# Patient Record
Sex: Female | Born: 1980 | Race: Asian | Hispanic: No | Marital: Married | Smoking: Former smoker
Health system: Southern US, Community
[De-identification: ages and names within clinical notes are randomized; demographics above are authoritative.]

## PROBLEM LIST (undated history)

## (undated) ENCOUNTER — Inpatient Hospital Stay (HOSPITAL_COMMUNITY): Payer: Self-pay

## (undated) DIAGNOSIS — Z789 Other specified health status: Secondary | ICD-10-CM

## (undated) DIAGNOSIS — L309 Dermatitis, unspecified: Secondary | ICD-10-CM

## (undated) DIAGNOSIS — E739 Lactose intolerance, unspecified: Secondary | ICD-10-CM

## (undated) DIAGNOSIS — A483 Toxic shock syndrome: Secondary | ICD-10-CM

## (undated) DIAGNOSIS — R011 Cardiac murmur, unspecified: Secondary | ICD-10-CM

## (undated) DIAGNOSIS — T7840XA Allergy, unspecified, initial encounter: Secondary | ICD-10-CM

## (undated) HISTORY — DX: Other specified health status: Z78.9

## (undated) HISTORY — DX: Cardiac murmur, unspecified: R01.1

## (undated) HISTORY — PX: NO PAST SURGERIES: SHX2092

## (undated) HISTORY — DX: Allergy, unspecified, initial encounter: T78.40XA

---

## 2017-01-11 NOTE — L&D Delivery Note (Addendum)
Patient: Alyssa Oneill MRN: [id number]  GBS status: Negative   Patient is a 37 y.o. now G2P1011 s/p NSVD at [redacted]w[redacted]d, who was admitted for SOL. SROM 4h 39m prior to delivery with clear fluid, however during delivery found to have heavy meconium.     Delivery Note At 12:53 PM a viable female was delivered via Vaginal, Spontaneous (Presentation: Left OA).  APGAR: 9, 9; weight pending.   Placenta status: Spontaneous, with retained placental tissue and membrane, manual sweep performed as below. Cord: Intact, 3 vessel with the following complications: retained placenta.   Anesthesia:   Episiotomy: None Lacerations: 2nd degree;Perineal Suture Repair: 3.0 vicryl Est. Blood Loss (mL): ~850   Head delivered left OA. No nuchal cord present. Shoulder and body delivered in usual fashion. Infant with spontaneous cry, placed on mother's abdomen, dried and bulb suctioned. Cord clamped x 2 after 1-minute delay, and cut by family member. Cord blood drawn. Placenta delivered spontaneously with gentle cord traction. Fundus firm with massage and Pitocin. Noted consistent trickle of uterine bleeding, gave cytotec 800mg  rectally. Further evaluated placenta, noted a chuck of placenta tissue and membrane missing. Dr. Aneta Mins and Dr. Debroah Loop performed a uterine sweep with successful retrieval of remaining membrane and placental tissue. Will receive 2g Ancef IV once. Perineum inspected and found to have a 2nd degree laceration, which was repaired with 4.0 vicryl with good hemostasis achieved.  Mom to postpartum.  Baby to Couplet care / Skin to Skin.  Allayne Stack 11/04/2017, 1:51 PM   OB FELLOW DELIVERY ATTESTATION  I was gloved and present for the delivery in its entirety, and I agree with the above resident's note.    Gwenevere Abbot, MD OB Fellow  11/04/2017, 7:50 PM

## 2017-03-17 ENCOUNTER — Ambulatory Visit (INDEPENDENT_AMBULATORY_CARE_PROVIDER_SITE_OTHER): Payer: Self-pay | Admitting: Family Medicine

## 2017-03-17 ENCOUNTER — Encounter: Payer: Self-pay | Admitting: Family Medicine

## 2017-03-17 VITALS — BP 94/72 | HR 76 | Temp 98.5°F | Ht 60.0 in | Wt 99.7 lb

## 2017-03-17 DIAGNOSIS — Z91018 Allergy to other foods: Secondary | ICD-10-CM

## 2017-03-17 DIAGNOSIS — Z7689 Persons encountering health services in other specified circumstances: Secondary | ICD-10-CM

## 2017-03-17 DIAGNOSIS — Z3A01 Less than 8 weeks gestation of pregnancy: Secondary | ICD-10-CM

## 2017-03-17 MED ORDER — EPINEPHRINE 0.3 MG/0.3ML IJ SOAJ: 0.3000 mg | Freq: Once | INTRAMUSCULAR | 2 refills | Status: AC

## 2017-03-17 NOTE — Patient Instructions (Addendum)
Continue taking prenatal vitamins each day.  You should find an Obstetrician to establish care with as well. Remember to increase your intake of water daily. You can take Tylenol as needed for any aches and pain she might have. First Trimester of Pregnancy The first trimester of pregnancy is from week 1 until the end of week 13 (months 1 through 3). A week after a sperm fertilizes an egg, the egg will implant on the wall of the uterus. This embryo will begin to develop into a baby. Genes from you and your partner will form the baby. The female genes will determine whether the baby will be a boy or a girl. At 6-8 weeks, the eyes and face will be formed, and the heartbeat can be seen on ultrasound. At the end of 12 weeks, all the baby's organs will be formed. Now that you are pregnant, you will want to do everything you can to have a healthy baby. Two of the most important things are to get good prenatal care and to follow your health care provider's instructions. Prenatal care is all the medical care you receive before the baby's birth. This care will help prevent, find, and treat any problems during the pregnancy and childbirth. Body changes during your first trimester Your body goes through many changes during pregnancy. The changes vary from woman to woman.  You may gain or lose a couple of pounds at first.  You may feel sick to your stomach (nauseous) and you may throw up (vomit). If the vomiting is uncontrollable, call your health care provider.  You may tire easily.  You may develop headaches that can be relieved by medicines. All medicines should be approved by your health care provider.  You may urinate more often. Painful urination may mean you have a bladder infection.  You may develop heartburn as a result of your pregnancy.  You may develop constipation because certain hormones are causing the muscles that push stool through your intestines to slow down.  You may develop hemorrhoids  or swollen veins (varicose veins).  Your breasts may begin to grow larger and become tender. Your nipples may stick out more, and the tissue that surrounds them (areola) may become darker.  Your gums may bleed and may be sensitive to brushing and flossing.  Dark spots or blotches (chloasma, mask of pregnancy) may develop on your face. This will likely fade after the baby is born.  Your menstrual periods will stop.  You may have a loss of appetite.  You may develop cravings for certain kinds of food.  You may have changes in your emotions from day to day, such as being excited to be pregnant or being concerned that something may go wrong with the pregnancy and baby.  You may have more vivid and strange dreams.  You may have changes in your hair. These can include thickening of your hair, rapid growth, and changes in texture. Some women also have hair loss during or after pregnancy, or hair that feels dry or thin. Your hair will most likely return to normal after your baby is born.  What to expect at prenatal visits During a routine prenatal visit:  You will be weighed to make sure you and the baby are growing normally.  Your blood pressure will be taken.  Your abdomen will be measured to track your baby's growth.  The fetal heartbeat will be listened to between weeks 10 and 14 of your pregnancy.  Test results from any previous  visits will be discussed.  Your health care provider may ask you:  How you are feeling.  If you are feeling the baby move.  If you have had any abnormal symptoms, such as leaking fluid, bleeding, severe headaches, or abdominal cramping.  If you are using any tobacco products, including cigarettes, chewing tobacco, and electronic cigarettes.  If you have any questions.  Other tests that may be performed during your first trimester include:  Blood tests to find your blood type and to check for the presence of any previous infections. The tests will  also be used to check for low iron levels (anemia) and protein on red blood cells (Rh antibodies). Depending on your risk factors, or if you previously had diabetes during pregnancy, you may have tests to check for high blood sugar that affects pregnant women (gestational diabetes).  Urine tests to check for infections, diabetes, or protein in the urine.  An ultrasound to confirm the proper growth and development of the baby.  Fetal screens for spinal cord problems (spina bifida) and Down syndrome.  HIV (human immunodeficiency virus) testing. Routine prenatal testing includes screening for HIV, unless you choose not to have this test.  You may need other tests to make sure you and the baby are doing well.  Follow these instructions at home: Medicines  Follow your health care provider's instructions regarding medicine use. Specific medicines may be either safe or unsafe to take during pregnancy.  Take a prenatal vitamin that contains at least 600 micrograms (mcg) of folic acid.  If you develop constipation, try taking a stool softener if your health care provider approves. Eating and drinking  Eat a balanced diet that includes fresh fruits and vegetables, whole grains, good sources of protein such as meat, eggs, or tofu, and low-fat dairy. Your health care provider will help you determine the amount of weight gain that is right for you.  Avoid raw meat and uncooked cheese. These carry germs that can cause birth defects in the baby.  Eating four or five small meals rather than three large meals a day may help relieve nausea and vomiting. If you start to feel nauseous, eating a few soda crackers can be helpful. Drinking liquids between meals, instead of during meals, also seems to help ease nausea and vomiting.  Limit foods that are high in fat and processed sugars, such as fried and sweet foods.  To prevent constipation: ? Eat foods that are high in fiber, such as fresh fruits and  vegetables, whole grains, and beans. ? Drink enough fluid to keep your urine clear or pale yellow. Activity  Exercise only as directed by your health care provider. Most women can continue their usual exercise routine during pregnancy. Try to exercise for 30 minutes at least 5 days a week. Exercising will help you: ? Control your weight. ? Stay in shape. ? Be prepared for labor and delivery.  Experiencing pain or cramping in the lower abdomen or lower back is a good sign that you should stop exercising. Check with your health care provider before continuing with normal exercises.  Try to avoid standing for long periods of time. Move your legs often if you must stand in one place for a long time.  Avoid heavy lifting.  Wear low-heeled shoes and practice good posture.  You may continue to have sex unless your health care provider tells you not to. Relieving pain and discomfort  Wear a good support bra to relieve breast tenderness.  Take  warm sitz baths to soothe any pain or discomfort caused by hemorrhoids. Use hemorrhoid cream if your health care provider approves.  Rest with your legs elevated if you have leg cramps or low back pain.  If you develop varicose veins in your legs, wear support hose. Elevate your feet for 15 minutes, 3-4 times a day. Limit salt in your diet. Prenatal care  Schedule your prenatal visits by the twelfth week of pregnancy. They are usually scheduled monthly at first, then more often in the last 2 months before delivery.  Write down your questions. Take them to your prenatal visits.  Keep all your prenatal visits as told by your health care provider. This is important. Safety  Wear your seat belt at all times when driving.  Make a list of emergency phone numbers, including numbers for family, friends, the hospital, and police and fire departments. General instructions  Ask your health care provider for a referral to a local prenatal education class.  Begin classes no later than the beginning of month 6 of your pregnancy.  Ask for help if you have counseling or nutritional needs during pregnancy. Your health care provider can offer advice or refer you to specialists for help with various needs.  Do not use hot tubs, steam rooms, or saunas.  Do not douche or use tampons or scented sanitary pads.  Do not cross your legs for long periods of time.  Avoid cat litter boxes and soil used by cats. These carry germs that can cause birth defects in the baby and possibly loss of the fetus by miscarriage or stillbirth.  Avoid all smoking, herbs, alcohol, and medicines not prescribed by your health care provider. Chemicals in these products affect the formation and growth of the baby.  Do not use any products that contain nicotine or tobacco, such as cigarettes and e-cigarettes. If you need help quitting, ask your health care provider. You may receive counseling support and other resources to help you quit.  Schedule a dentist appointment. At home, brush your teeth with a soft toothbrush and be gentle when you floss. Contact a health care provider if:  You have dizziness.  You have mild pelvic cramps, pelvic pressure, or nagging pain in the abdominal area.  You have persistent nausea, vomiting, or diarrhea.  You have a bad smelling vaginal discharge.  You have pain when you urinate.  You notice increased swelling in your face, hands, legs, or ankles.  You are exposed to fifth disease or chickenpox.  You are exposed to [place] measles (rubella) and have never had it. Get help right away if:  You have a fever.  You are leaking fluid from your vagina.  You have spotting or bleeding from your vagina.  You have severe abdominal cramping or pain.  You have rapid weight gain or loss.  You vomit blood or material that looks like coffee grounds.  You develop a severe headache.  You have shortness of breath.  You have any kind of  trauma, such as from a fall or a car accident. Summary  The first trimester of pregnancy is from week 1 until the end of week 13 (months 1 through 3).  Your body goes through many changes during pregnancy. The changes vary from woman to woman.  You will have routine prenatal visits. During those visits, your health care provider will examine you, discuss any test results you may have, and talk with you about how you are feeling. This information is not intended  to replace advice given to you by your health care provider. Make sure you discuss any questions you have with your health care provider. Document Released: 12/22/2000 Document Revised: 12/10/2015 Document Reviewed: 12/10/2015 Elsevier Interactive Patient Education  2018 [employer]  How a Baby Grows During Pregnancy Pregnancy begins when a female's sperm enters a female's egg (fertilization). This happens in one of the tubes (fallopian tubes) that connect the ovaries to the womb (uterus). The fertilized egg is called an embryo until it reaches 10 weeks. From 10 weeks until birth, it is called a fetus. The fertilized egg moves down the fallopian tube to the uterus. Then it implants into the lining of the uterus and begins to grow. The developing fetus receives oxygen and nutrients through the pregnant woman's bloodstream and the tissues that grow (placenta) to support the fetus. The placenta is the life support system for the fetus. It provides nutrition and removes waste. Learning as much as you can about your pregnancy and how your baby is developing can help you enjoy the experience. It can also make you aware of when there might be a problem and when to ask questions. How long does a typical pregnancy last? A pregnancy usually lasts 280 days, or about 40 weeks. Pregnancy is divided into three trimesters:  First trimester: 0-13 weeks.  Second trimester: 14-27 weeks.  Third trimester: 28-40 weeks.  The day when your baby is  considered ready to be born (full term) is your estimated date of delivery. How does my baby develop month by month? First month  The fertilized egg attaches to the inside of the uterus.  Some cells will form the placenta. Others will form the fetus.  The arms, legs, brain, spinal cord, lungs, and heart begin to develop.  At the end of the first month, the heart begins to beat.  Second month  The bones, inner ear, eyelids, hands, and feet form.  The genitals develop.  By the end of 8 weeks, all major organs are developing.  Third month  All of the internal organs are forming.  Teeth develop below the gums.  Bones and muscles begin to grow. The spine can flex.  The skin is transparent.  Fingernails and toenails begin to form.  Arms and legs continue to grow longer, and hands and feet develop.  The fetus is about 3 in (7.6 cm) long.  Fourth month  The placenta is completely formed.  The external sex organs, neck, outer ear, eyebrows, eyelids, and fingernails are formed.  The fetus can hear, swallow, and move its arms and legs.  The kidneys begin to produce urine.  The skin is covered with a white waxy coating (vernix) and very fine hair (lanugo).  Fifth month  The fetus moves around more and can be felt for the first time (quickening).  The fetus starts to sleep and wake up and may begin to suck its finger.  The nails grow to the end of the fingers.  The organ in the digestive system that makes bile (gallbladder) functions and helps to digest the nutrients.  If your baby is a girl, eggs are present in her ovaries. If your baby is a boy, testicles start to move down into his scrotum.  Sixth month  The lungs are formed, but the fetus is not yet able to breathe.  The eyes open. The brain continues to develop.  Your baby has fingerprints and toe prints. Your baby's hair grows thicker.  At the end of  the second trimester, the fetus is about 9 in (22.9 cm)  long.  Seventh month  The fetus kicks and stretches.  The eyes are developed enough to sense changes in light.  The hands can make a grasping motion.  The fetus responds to sound.  Eighth month  All organs and body systems are fully developed and functioning.  Bones harden and taste buds develop. The fetus may hiccup.  Certain areas of the brain are still developing. The skull remains soft.  Ninth month  The fetus gains about  lb (0.23 kg) each week.  The lungs are fully developed.  Patterns of sleep develop.  The fetus's head typically moves into a head-down position (vertex) in the uterus to prepare for birth. If the buttocks move into a vertex position instead, the baby is breech.  The fetus weighs 6-9 lbs (2.72-4.08 kg) and is 19-20 in (48.26-50.8 cm) long.  What can I do to have a healthy pregnancy and help my baby develop? Eating and Drinking  Eat a healthy diet. ? Talk with your health care provider to make sure that you are getting the nutrients that you and your baby need. ? Visit www.[employer].be to learn about creating a healthy diet.  Gain a healthy amount of weight during pregnancy as advised by your health care provider. This is usually 25-35 pounds. You may need to: ? Gain more if you were underweight before getting pregnant or if you are pregnant with more than one baby. ? Gain less if you were overweight or obese when you got pregnant.  Medicines and Vitamins  Take prenatal vitamins as directed by your health care provider. These include vitamins such as folic acid, iron, calcium, and vitamin D. They are important for healthy development.  Take medicines only as directed by your health care provider. Read labels and ask a pharmacist or your health care provider whether over-the-counter medicines, supplements, and prescription drugs are safe to take during pregnancy.  Activities  Be physically active as advised by your health care provider. Ask  your health care provider to recommend activities that are safe for you to do, such as walking or swimming.  Do not participate in strenuous or extreme sports.  Lifestyle  Do not drink alcohol.  Do not use any tobacco products, including cigarettes, chewing tobacco, or electronic cigarettes. If you need help quitting, ask your health care provider.  Do not use illegal drugs.  Safety  Avoid exposure to mercury, lead, or other heavy metals. Ask your health care provider about common sources of these heavy metals.  Avoid listeria infection during pregnancy. Follow these precautions: ? Do not eat soft cheeses or deli meats. ? Do not eat hot dogs unless they have been warmed up to the point of steaming, such as in the microwave oven. ? Do not drink unpasteurized milk.  Avoid toxoplasmosis infection during pregnancy. Follow these precautions: ? Do not change your cat's litter box, if you have a cat. Ask someone else to do this for you. ? Wear gardening gloves while working in the yard.  General Instructions  Keep all follow-up visits as directed by your health care provider. This is important. This includes prenatal care and screening tests.  Manage any chronic health conditions. Work closely with your health care provider to keep conditions, such as diabetes, under control.  How do I know if my baby is developing well? At each prenatal visit, your health care provider will do several different tests to check on  your health and keep track of your baby's development. These include:  Fundal height. ? Your health care provider will measure your growing belly from top to bottom using a tape measure. ? Your health care provider will also feel your belly to determine your baby's position.  Heartbeat. ? An ultrasound in the first trimester can confirm pregnancy and show a heartbeat, depending on how far along you are. ? Your health care provider will check your baby's heart rate at every  prenatal visit. ? As you get closer to your delivery date, you may have regular fetal heart rate monitoring to make sure that your baby is not in distress.  Second trimester ultrasound. ? This ultrasound checks your baby's development. It also indicates your baby's gender.  What should I do if I have concerns about my baby's development? Always talk with your health care provider about any concerns that you may have. This information is not intended to replace advice given to you by your health care provider. Make sure you discuss any questions you have with your health care provider. Document Released: 06/16/2007 Document Revised: 06/05/2015 Document Reviewed: 06/06/2013 Elsevier Interactive Patient Education  2018 [employer].  Pregnancy After Age 56 Women who become pregnant after the age of 73 have a higher risk for certain problems during pregnancy. This is because older women may already have health problems before becoming pregnant. Older women who are healthy before pregnancy may still develop problems during pregnancy. These problems may affect the mother, the unborn baby (fetus), or both. What are the risks for me? If you are over age 73 and you want to become pregnant or are pregnant, you may have a higher risk of:  Not being able to get pregnant (infertility).  Going into labor early (preterm labor).  Needing surgical delivery of your baby (cesarean delivery, or C-section).  Having high blood pressure (hypertension).  Having complications during pregnancy, such as high blood pressure and other symptoms (preeclampsia).  Having diabetes during pregnancy (gestational diabetes).  Being pregnant with more than one baby.  Loss of the unborn baby before 20 weeks (miscarriage) or after 20 weeks of pregnancy (stillbirth).  What are the risks for my baby? Babies born to women over the age of 47 have a higher risk for:  Being born early (prematurity).  Low birth weight, which  is less than 5 lb, 8 oz (2.5 kg).  Birth defects, such as Down syndrome and cleft palate.  Health complications, including problems with growth and development.  How is prenatal care different for women over age 1? All women should see their health care provider before they try to become pregnant. This is especially important for women over the age of 42. Tell your health care provider about:  Any health problems you have.  Any medicines you take.  Any family history of health problems or chromosome-related defects.  Any problems you have had with past pregnancies or deliveries.  If you are over age 74 and you plan to become pregnant:  Start taking a daily multivitamin a month or more before you try to get pregnant. Your multivitamin should contain 400 mcg (micrograms) of folic acid.  If you are over age 60 and pregnant, make sure you:  Keep taking your multivitamin unless your health care provider tells you not to take it.  Keep all prenatal visits as told by your health care provider. This is important.  Have ultrasounds regularly throughout your pregnancy to check for problems.  Talk  with your health care provider about other prenatal screening tests that you may need.  What additional prenatal tests are needed? Screening tests show whether your baby has a higher risk for birth defects than other babies. Screening tests include:  Ultrasound tests to look for markers that indicate a risk for birth defects.  Maternal blood screening. These are blood tests that measure certain substances in your blood to determine your baby's risk for defects.  Screening tests do not show whether your baby has or does not have defects. They only show your baby's risk for certain defects. If your screening tests show that risk factors are present, you may need tests to confirm the defect (diagnostic testing). These tests may include:  Chorionic villus sampling. For this procedure, a tissue  sample is taken from the organ that forms in your uterus to nourish your baby (placenta). The sample is removed through your cervix or abdomen and tested.  Amniocentesis. For this procedure, a small amount of the fluid that surrounds the baby in the uterus (amniotic fluid) is removed and tested.  What can I do to stay healthy during my pregnancy? Staying healthy during pregnancy can help you and your baby to have a lower risk for problems during pregnancy, during delivery, or both. Talk with your health care provider for specific instructions about staying healthy during your pregnancy. Nutrition  At each meal, eat a variety of foods from each of the five food groups. These groups include: ? Proteins such as lean meats, poultry, fish that is low in fat, beans, eggs, and nuts. ? Vegetables such as leafy greens, raw and cooked vegetables, and vegetable juice. ? Fruits that are fresh, frozen, or canned, or 100% fruit juice. ? Dairy products such as low-fat yogurt, cheese, and milk. ? Whole grains including rice, cereal, pasta, and bread.  Talk with your health care provider about how much food in each group is right for you.  Follow instructions from your health care provider about eating and drinking restrictions during pregnancy. ? Do not eat raw eggs, raw meat, or raw fish or seafood. ? Do not eat any fish that contains high amounts of mercury, such as swordfish or mackerel.  Drink 6-8 or more glasses of water a day. You should drink enough fluid to keep your urine pale yellow. Managing weight gain  Ask your health care provider how much weight gain is healthy during pregnancy.  Stay at a healthy weight. If needed, work with your health care provider to lose weight safely. Activity  Exercise regularly, as directed by your health care provider. Ask your health care provider what forms of exercise are safe for you. General instructions  Do not use any products that contain nicotine or  tobacco, such as cigarettes and e-cigarettes. If you need help quitting, ask your health care provider.  Do not drink alcohol, use drugs, or abuse prescription medicine.  Take over-the-counter and prescription medicines only as told by your health care provider.  Do not use hot tubs, steam rooms, or saunas.  Talk with your health care provider about your risk of exposure to harmful environmental conditions. This includes exposure to chemicals, radiation, cleaning products, and cat feces. Follow advice from your health care provider about how to limit your exposure. Summary  Women who become pregnant after the age of 42 have a higher risk for complications during pregnancy.  Problems may affect the mother, the unborn baby (fetus), or both.  All women should see their health  care provider before they try to become pregnant. This is especially important for women over the age of 73.  Staying healthy during pregnancy can help both you and your baby to have a lower risk for some of the problems that can happen during pregnancy, during delivery, or both. This information is not intended to replace advice given to you by your health care provider. Make sure you discuss any questions you have with your health care provider. Document Released: 04/19/2016 Document Revised: 04/19/2016 Document Reviewed: 04/19/2016 Elsevier Interactive Patient Education  2018 [employer].

## 2017-03-17 NOTE — Progress Notes (Signed)
Patient presents to clinic today to establish care.  She is accompanied by her boyfriend.  SUBJECTIVE: PMH: Pt is a 37 yo with pmh sig for seasonal allergies, eczema, heart murmur, history of anemia.  Pt recently moved to the area from [place].  Pregnancy: -LMP 02/04/17 -Patient endorses taking 2 positive pregnancy tests.  The last one was yesterday. -Pt does not have an OB provider in the area -Pt is a G1P0 -pt states she has been taking PNVs. -she denies vaginal d/c, urinary frequency, dysuria, vaginal bleeding, or pelvic pain, constipation. -pt endorses some nausea, pelvic pressure/bloating. -Patient drinking maybe 2 (16.9 oz) bottles of water per day.  Heart murmur: -Patient states she was told this is a child -Patient endorses workup including echo which was done sometime ago. -Patient denies palpitations, chest pain, shortness of breath.  Allergies: Advil-eye swelling, cannot breathe Pecans and walnuts-facial edema, hives, diarrhea Avocados and bananas-itchy throat  Past surgical history: None  Social history: She is accompanied by her boyfriend visit.  Patient is from the [place] area.  Patient currently works at [employer].  Patient denies tobacco, alcohol, drug use.  Family medical history: Mom-alive, HLD Dad-deceased, HTN, stroke Sister-Francis, alive Sister-Vanessa, alive  Health Maintenance: PAP -- unsure   Past Medical History:  Diagnosis Date  . Allergy   . Heart murmur     History reviewed. No pertinent surgical history.  Current Outpatient Medications on File Prior to Visit  Medication Sig Dispense Refill  . Prenatal Multivit-Min-Fe-FA (PRENATAL VITAMINS PO) Take by mouth daily.     No current facility-administered medications on file prior to visit.     Allergies  Allergen Reactions  . Advil [Ibuprofen] Anaphylaxis    Family History  Problem Relation Age of Onset  . Hyperlipidemia Mother   . Hypertension Father   . Stroke Father      Social History   Socioeconomic History  . Marital status: Unknown    Spouse name: Not on file  . Number of children: Not on file  . Years of education: Not on file  . Highest education level: Not on file  Social Needs  . Financial resource strain: Not on file  . Food insecurity - worry: Not on file  . Food insecurity - inability: Not on file  . Transportation needs - medical: Not on file  . Transportation needs - non-medical: Not on file  Occupational History  . Not on file  Tobacco Use  . Smoking status: Never Smoker  . Smokeless tobacco: Never Used  Substance and Sexual Activity  . Alcohol use: No    Frequency: Never  . Drug use: No  . Sexual activity: Yes  Other Topics Concern  . Not on file  Social History Narrative  . Not on file    ROS General: Denies fever, chills, night sweats, changes in weight, changes in appetite HEENT: Denies headaches, ear pain, changes in vision, rhinorrhea, sore throat CV: Denies CP, palpitations, SOB, orthopnea Pulm: Denies SOB, cough, wheezing GI: Denies abdominal pain, nausea, vomiting, diarrhea, constipation GU: Denies dysuria, hematuria, frequency, vaginal discharge  +Urine pregnancy tests, absent menses. Msk: Denies muscle cramps, joint pains Neuro: Denies weakness, numbness, tingling Skin: Denies rashes, bruising Psych: Denies depression, anxiety, hallucinations  BP 94/72 (BP Location: Left Arm, Patient Position: Sitting, Cuff Size: Normal)   Pulse 76   Temp 98.5 F (36.9 C) (Oral)   Ht 5' (1.524 m)   Wt 99 lb 11.2 oz (45.2 kg)   LMP  02/04/2017 (Exact Date)   SpO2 98%   BMI 19.47 kg/m   Physical Exam Gen. Pleasant, well developed, well-nourished, in NAD HEENT - Carrier/AT, PERRL, no scleral icterus, no nasal drainage, pharynx without erythema or exudate. Lungs: no use of accessory muscles, no dullness to percussion, CTAB, no wheezes, rales or rhonchi Cardiovascular: RRR, 2/6 murmur best heard at L lower sternal border,  no peripheral edema Abdomen: BS present, soft, nontender,nondistended Neuro:  A&Ox3, CN II-XII intact, normal gait Skin:  Warm, dry, intact, no lesions Psych: normal affect, mood appropriate  No results found for this or any previous visit (from the past 2160 hour(s)).  Assessment/Plan: Less than [redacted] weeks gestation of pregnancy -high risk 2/2 AMA -LMP 02/04/17.  EDD 11/11/17.  Pt approx. 5wk6d  G1P0 -Pt advised to establish care with OB/Gyn for Ob intake. -Pt given a list of area OB providers. -Pt advised to continue PNV, increase po intake of water, etc -pt given several handouts -Given ED precautions for sudden pain, bleeding, etc.  Encounter to establish care -We reviewed the PMH, PSH, FH, SH, Meds and Allergies. -We provided refills for any medications we will prescribe as needed. -We addressed current concerns per orders and patient instructions. -We have asked for records for pertinent exams, studies, vaccines and notes from previous providers. -We have advised patient to follow up per instructions below.  Multiple food allergies -Allergy to pecans, walnuts, avocado, bananas -Rx sent in for EpiPen  F/u prn  Abbe AmsterdamShannon Thessaly Mccullers, MD

## 2017-05-02 ENCOUNTER — Encounter (HOSPITAL_COMMUNITY): Payer: Self-pay | Admitting: Student

## 2017-05-02 ENCOUNTER — Encounter: Payer: Self-pay | Admitting: Family Medicine

## 2017-05-02 ENCOUNTER — Inpatient Hospital Stay (HOSPITAL_COMMUNITY): Payer: Medicaid Other

## 2017-05-02 ENCOUNTER — Inpatient Hospital Stay (HOSPITAL_COMMUNITY)
Admission: AD | Admit: 2017-05-02 | Discharge: 2017-05-02 | Disposition: A | Discharge status: Home / Self Care | Payer: Medicaid Other | Source: Ambulatory Visit | Attending: Obstetrics & Gynecology | Admitting: Obstetrics & Gynecology

## 2017-05-02 DIAGNOSIS — Z87891 Personal history of nicotine dependence: Secondary | ICD-10-CM | POA: Insufficient documentation

## 2017-05-02 DIAGNOSIS — Z8249 Family history of ischemic heart disease and other diseases of the circulatory system: Secondary | ICD-10-CM | POA: Insufficient documentation

## 2017-05-02 DIAGNOSIS — O209 Hemorrhage in early pregnancy, unspecified: Secondary | ICD-10-CM

## 2017-05-02 DIAGNOSIS — Z91018 Allergy to other foods: Secondary | ICD-10-CM | POA: Insufficient documentation

## 2017-05-02 DIAGNOSIS — Z886 Allergy status to analgesic agent status: Secondary | ICD-10-CM | POA: Insufficient documentation

## 2017-05-02 DIAGNOSIS — O468X1 Other antepartum hemorrhage, first trimester: Secondary | ICD-10-CM

## 2017-05-02 DIAGNOSIS — O418X10 Other specified disorders of amniotic fluid and membranes, first trimester, not applicable or unspecified: Secondary | ICD-10-CM

## 2017-05-02 DIAGNOSIS — O09521 Supervision of elderly multigravida, first trimester: Secondary | ICD-10-CM | POA: Diagnosis not present

## 2017-05-02 DIAGNOSIS — N939 Abnormal uterine and vaginal bleeding, unspecified: Secondary | ICD-10-CM | POA: Diagnosis present

## 2017-05-02 DIAGNOSIS — Z3A12 12 weeks gestation of pregnancy: Secondary | ICD-10-CM

## 2017-05-02 DIAGNOSIS — O208 Other hemorrhage in early pregnancy: Secondary | ICD-10-CM | POA: Insufficient documentation

## 2017-05-02 DIAGNOSIS — Z823 Family history of stroke: Secondary | ICD-10-CM | POA: Diagnosis not present

## 2017-05-02 HISTORY — DX: Toxic shock syndrome: A48.3

## 2017-05-02 HISTORY — DX: Dermatitis, unspecified: L30.9

## 2017-05-02 HISTORY — DX: Lactose intolerance, unspecified: E73.9

## 2017-05-02 LAB — URINALYSIS, ROUTINE W REFLEX MICROSCOPIC
Bilirubin Urine: NEGATIVE
GLUCOSE, UA: NEGATIVE mg/dL
Ketones, ur: NEGATIVE mg/dL
Leukocytes, UA: NEGATIVE
Nitrite: NEGATIVE
Protein, ur: NEGATIVE mg/dL
SPECIFIC GRAVITY, URINE: 1.004 — AB (ref 1.005–1.030)
pH: 8 (ref 5.0–8.0)

## 2017-05-02 LAB — ABO/RH: ABO/RH(D): O POS

## 2017-05-02 NOTE — MAU Provider Note (Signed)
History     CSN: [id number]  Arrival date and time: 05/02/17 1437   First Provider Initiated Contact with Patient 05/02/17 1537      Chief Complaint  Patient presents with  . Vaginal Bleeding   HPI  Alyssa Oneill is a 37 y.o. G2P0010 at [redacted]w[redacted]d by LMP who presents with vaginal bleeding. VB started this morning. Filled a small pad this morning. Bleeding has decreased. Not saturating pads or passing clots. Has some lower abdominal cramping last night that resolved without intervention. Last had intercourse over the weekend. Has not started prenatal care.   OB History    Gravida  2   Para  0   Term  0   Preterm  0   AB  1   Living  0     SAB  1   TAB  0   Ectopic  0   Multiple  0   Live Births  0           Past Medical History:  Diagnosis Date  . Allergy   . Eczema   . Heart murmur   . Lactose intolerance   . 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 NO PAST SURGERIES      Family History  Problem Relation Age of Onset  . Hyperlipidemia Mother   . Hypertension Father   . Stroke Father     Social History   Tobacco Use  . Smoking status: Former Games developer  . Smokeless tobacco: Never Used  Substance Use Topics  . Alcohol use: Not Currently    Frequency: Never  . Drug use: No    Allergies:  Allergies  Allergen Reactions  . Advil [Ibuprofen] Anaphylaxis  . Avocado     Itchy throat  . Banana     Itchy throat  . Other     Walnuts and peacans--facial edema, hives, diarrhea    Medications Prior to Admission  Medication Sig Dispense Refill Last Dose  . Prenatal Multivit-Min-Fe-FA (PRENATAL VITAMINS PO) Take by mouth daily.   Taking    Review of Systems  Constitutional: Negative.   Gastrointestinal: Negative.   Genitourinary: Positive for vaginal bleeding.   Physical Exam   Blood pressure 109/79, pulse 82, temperature 98.1 F (36.7 C), resp. rate 20, last menstrual period 02/04/2017, SpO2 100  %.  Physical Exam  Nursing note and vitals reviewed. Constitutional: She is oriented to person, place, and time. She appears well-developed and well-nourished. No distress.  HENT:  Head: Normocephalic and atraumatic.  Eyes: Conjunctivae are normal. Right eye exhibits no discharge. Left eye exhibits no discharge. No scleral icterus.  Neck: Normal range of motion.  Cardiovascular: Normal rate, regular rhythm and normal heart sounds.  No murmur heard. Respiratory: Effort normal and breath sounds normal. No respiratory distress. She has no wheezes.  GI: Soft. Bowel sounds are normal. There is no tenderness.  Genitourinary: There is bleeding in the vagina.  Genitourinary Comments: Small amount of dark red blood. No clots. No active bleeding. Cervix closed.   Neurological: She is alert and oriented to person, place, and time.  Skin: Skin is warm and dry. She is not diaphoretic.  Psychiatric: She has a normal mood and affect. Her behavior is normal. Judgment and thought content normal.    MAU Course  Procedures Results for orders placed or performed during the hospital encounter of 05/02/17 (from the past 24 hour(s))  Urinalysis, Routine w reflex microscopic  Status: Abnormal   Collection Time: 05/02/17  3:00 PM  Result Value Ref Range   Color, Urine STRAW (A) YELLOW   APPearance CLEAR CLEAR   Specific Gravity, Urine 1.004 (L) 1.005 - 1.030   pH 8.0 5.0 - 8.0   Glucose, UA NEGATIVE NEGATIVE mg/dL   Hgb urine dipstick LARGE (A) NEGATIVE   Bilirubin Urine NEGATIVE NEGATIVE   Ketones, ur NEGATIVE NEGATIVE mg/dL   Protein, ur NEGATIVE NEGATIVE mg/dL   Nitrite NEGATIVE NEGATIVE   Leukocytes, UA NEGATIVE NEGATIVE   RBC / HPF 0-5 0 - 5 RBC/hpf   WBC, UA 0-5 0 - 5 WBC/hpf   Bacteria, UA RARE (A) NONE SEEN   Squamous Epithelial / LPF 0-5 (A) NONE SEEN  ABO/Rh     Status: None   Collection Time: 05/02/17  3:58 PM  Result Value Ref Range   ABO/RH(D)      O POS Performed at [facility], [address] [place], [place] [address]    [place] Ob Comp Less 14 Wks  Result Date: 05/02/2017 CLINICAL DATA:  Pregnant patient with vaginal bleeding. EXAM: OBSTETRIC <14 WK ULTRASOUND TECHNIQUE: Transabdominal ultrasound was performed for evaluation of the gestation as well as the maternal uterus and adnexal regions. COMPARISON:  None. FINDINGS: Intrauterine gestational sac: Single Yolk sac:  Not Visualized. Embryo:  Visualized. Cardiac Activity: Visualized. Heart Rate: 171 bpm CRL:   71.5 mm   13 w 2 d                  US EDC: 11/05/2017 Subchorionic hemorrhage: Moderate subchorionic hemorrhage measuring 4.4 x 2.2 x 3.2 cm Maternal uterus/adnexae: Normal right and left ovaries. No free fluid in the pelvis. IMPRESSION: Single live intrauterine gestation. Moderate subchorionic hemorrhage. Electronically Signed   By: Annia Beltrew  Davis M.D.   On: 05/02/2017 16:47    MDM FHR 166 by doppler O positive Small amount of blood noted on exam. Cervix closed [facility]trasound [facility] Discussed results with patient  Assessment and Plan  A: 1. Subchorionic hematoma in first trimester, single or unspecified fetus   2. [redacted] weeks gestation of pregnancy   3. Vaginal bleeding in pregnancy, first trimester    P: Discharge home Pelvic rest Bleeding precautions List of ob/gyns given to patient -- start prenatal care ASAP  Alyssa Oneill 05/02/2017, 3:37 PM

## 2017-05-02 NOTE — MAU Note (Signed)
Patient presents with vaginal bleeding that started this AM.  Filling a normal pad.  Did have lower abdominal cramping earlier, but no pain now.  Last sex over the weekend.

## 2017-05-02 NOTE — Discharge Instructions (Signed)
[place] [facility] Providers ° ° °[facility]       Phone: [phone] ° °[facility] [place] Phone: [phone] ° °[facility] [place]  Phone: [phone] ° °[facility] [place]  Phone: [phone] ° °[facility] [place]  Phone: [phone] ° °[facility]       Phone: [phone] ° °[facility]    Phone: [phone]  ° °[facility] ([place]    Phone: [phone] ° °[facility]    Phone: [phone] ° °[facility]    Phone: [phone]  ° °[facility]  Phone: [phone] ° °[facility]    Phone: [phone] ° °[facility]   Phone: [phone] ° °[facility]    Phone: [phone] ° ° ° ° ° ° °Subchorionic Hematoma °A subchorionic hematoma is a gathering of blood between the outer wall of the placenta and the inner wall of the womb (uterus). The placenta is the organ that connects the fetus to the wall of the uterus. The placenta performs the feeding, breathing (oxygen to the fetus), and waste removal (excretory work) of the fetus. °Subchorionic hematoma is the most common abnormality found on a result from ultrasonography done during the first trimester or early second trimester of pregnancy. If there has been little or no vaginal bleeding, early small hematomas usually shrink on their own and do not affect your baby or pregnancy. The blood is gradually absorbed over 1-2 weeks. When bleeding starts later in pregnancy or the hematoma is larger or occurs in an older pregnant woman, the outcome may not be as good. Larger hematomas may get bigger, which increases the chances for miscarriage. Subchorionic hematoma also increases the risk of premature detachment of the  placenta from the uterus, preterm (premature) labor, and stillbirth. °Follow these instructions at home: °· Stay on bed rest if your health care provider recommends this. Although bed rest will not prevent more bleeding or prevent a miscarriage, your health care provider may recommend bed rest until you are advised otherwise. °· Avoid heavy lifting (more than 10 lb [4.5 kg]), exercise, sexual intercourse, or douching as directed by your health care provider. °· Keep track of the number of pads you use each day and how soaked (saturated) they are. Write down this information. °· Do not use tampons. °· Keep all follow-up appointments as directed by your health care provider. Your health care provider may ask you to have follow-up blood tests or ultrasound tests or both. °Get help right away if: °· You have severe cramps in your stomach, back, abdomen, or pelvis. °· You have a fever. °· You pass large clots or tissue. Save any tissue for your health care provider to look at. °· Your bleeding increases or you become lightheaded, feel weak, or have fainting episodes. °This information is not intended to replace advice given to you by your health care provider. Make sure you discuss any questions you have with your health care provider. °Document Released: 04/14/2006 Document Revised: 06/05/2015 Document Reviewed: 07/27/2012 °Elsevier Interactive Patient Education © 2017 [employer] ° °

## 2017-05-08 ENCOUNTER — Encounter: Payer: Self-pay | Admitting: Family Medicine

## 2017-05-25 ENCOUNTER — Encounter: Payer: Self-pay | Admitting: Family Medicine

## 2017-06-01 ENCOUNTER — Other Ambulatory Visit: Payer: Self-pay | Admitting: Family Medicine

## 2017-06-01 DIAGNOSIS — L309 Dermatitis, unspecified: Secondary | ICD-10-CM

## 2017-06-01 DIAGNOSIS — J302 Other seasonal allergic rhinitis: Secondary | ICD-10-CM

## 2017-06-03 ENCOUNTER — Other Ambulatory Visit (HOSPITAL_COMMUNITY)
Admission: RE | Admit: 2017-06-03 | Discharge: 2017-06-03 | Disposition: A | Discharge status: Home / Self Care | Payer: Medicaid Other | Source: Ambulatory Visit | Attending: Obstetrics and Gynecology | Admitting: Obstetrics and Gynecology

## 2017-06-03 ENCOUNTER — Other Ambulatory Visit: Payer: Self-pay | Admitting: Obstetrics and Gynecology

## 2017-06-03 ENCOUNTER — Telehealth: Payer: Self-pay

## 2017-06-03 ENCOUNTER — Encounter: Payer: Self-pay | Admitting: Obstetrics and Gynecology

## 2017-06-03 ENCOUNTER — Ambulatory Visit (INDEPENDENT_AMBULATORY_CARE_PROVIDER_SITE_OTHER): Payer: Medicaid Other | Admitting: Obstetrics and Gynecology

## 2017-06-03 DIAGNOSIS — Z3482 Encounter for supervision of other normal pregnancy, second trimester: Secondary | ICD-10-CM | POA: Diagnosis present

## 2017-06-03 DIAGNOSIS — O09529 Supervision of elderly multigravida, unspecified trimester: Secondary | ICD-10-CM | POA: Insufficient documentation

## 2017-06-03 DIAGNOSIS — Z3480 Encounter for supervision of other normal pregnancy, unspecified trimester: Secondary | ICD-10-CM | POA: Diagnosis not present

## 2017-06-03 DIAGNOSIS — Z3A17 17 weeks gestation of pregnancy: Secondary | ICD-10-CM | POA: Insufficient documentation

## 2017-06-03 DIAGNOSIS — O09522 Supervision of elderly multigravida, second trimester: Secondary | ICD-10-CM

## 2017-06-03 LAB — POCT URINALYSIS DIP (DEVICE)
BILIRUBIN URINE: NEGATIVE
Glucose, UA: NEGATIVE mg/dL
Ketones, ur: NEGATIVE mg/dL
NITRITE: NEGATIVE
PH: 6 (ref 5.0–8.0)
Protein, ur: NEGATIVE mg/dL
SPECIFIC GRAVITY, URINE: 1.025 (ref 1.005–1.030)
Urobilinogen, UA: 0.2 mg/dL (ref 0.0–1.0)

## 2017-06-03 NOTE — Telephone Encounter (Signed)
Called pt to make aware of [place] appt on 06/17/17 at 9am. No answer, left VM.

## 2017-06-03 NOTE — Patient Instructions (Signed)
Second Trimester of Pregnancy The second trimester is from week 14 through week 27 (months 4 through 6). The second trimester is often a time when you feel your best. Your body has adjusted to being pregnant, and you begin to feel better physically. Usually, morning sickness has lessened or quit completely, you may have more energy, and you may have an increase in appetite. The second trimester is also a time when the fetus is growing rapidly. At the end of the sixth month, the fetus is about 9 inches long and weighs about 1 pounds. You will likely begin to feel the baby move (quickening) between 16 and 20 weeks of pregnancy. Body changes during your second trimester Your body continues to go through many changes during your second trimester. The changes vary from woman to woman.  Your weight will continue to increase. You will notice your lower abdomen bulging out.  You may begin to get stretch marks on your hips, abdomen, and breasts.  You may develop headaches that can be relieved by medicines. The medicines should be approved by your health care provider.  You may urinate more often because the fetus is pressing on your bladder.  You may develop or continue to have heartburn as a result of your pregnancy.  You may develop constipation because certain hormones are causing the muscles that push waste through your intestines to slow down.  You may develop hemorrhoids or swollen, bulging veins (varicose veins).  You may have back pain. This is caused by: ? Weight gain. ? Pregnancy hormones that are relaxing the joints in your pelvis. ? A shift in weight and the muscles that support your balance.  Your breasts will continue to grow and they will continue to become tender.  Your gums may bleed and may be sensitive to brushing and flossing.  Dark spots or blotches (chloasma, mask of pregnancy) may develop on your face. This will likely fade after the baby is born.  A dark line from your  belly button to the pubic area (linea nigra) may appear. This will likely fade after the baby is born.  You may have changes in your hair. These can include thickening of your hair, rapid growth, and changes in texture. Some women also have hair loss during or after pregnancy, or hair that feels dry or thin. Your hair will most likely return to normal after your baby is born.  What to expect at prenatal visits During a routine prenatal visit:  You will be weighed to make sure you and the fetus are growing normally.  Your blood pressure will be taken.  Your abdomen will be measured to track your baby's growth.  The fetal heartbeat will be listened to.  Any test results from the previous visit will be discussed.  Your health care provider may ask you:  How you are feeling.  If you are feeling the baby move.  If you have had any abnormal symptoms, such as leaking fluid, bleeding, severe headaches, or abdominal cramping.  If you are using any tobacco products, including cigarettes, chewing tobacco, and electronic cigarettes.  If you have any questions.  Other tests that may be performed during your second trimester include:  Blood tests that check for: ? Low iron levels (anemia). ? High blood sugar that affects pregnant women (gestational diabetes) between 24 and 28 weeks. ? Rh antibodies. This is to check for a protein on red blood cells (Rh factor).  Urine tests to check for infections, diabetes, or   protein in the urine.  An ultrasound to confirm the proper growth and development of the baby.  An amniocentesis to check for possible genetic problems.  Fetal screens for spina bifida and Down syndrome.  HIV (human immunodeficiency virus) testing. Routine prenatal testing includes screening for HIV, unless you choose not to have this test.  Follow these instructions at home: Medicines  Follow your health care provider's instructions regarding medicine use. Specific  medicines may be either safe or unsafe to take during pregnancy.  Take a prenatal vitamin that contains at least 600 micrograms (mcg) of folic acid.  If you develop constipation, try taking a stool softener if your health care provider approves. Eating and drinking  Eat a balanced diet that includes fresh fruits and vegetables, whole grains, good sources of protein such as meat, eggs, or tofu, and low-fat dairy. Your health care provider will help you determine the amount of weight gain that is right for you.  Avoid raw meat and uncooked cheese. These carry germs that can cause birth defects in the baby.  If you have low calcium intake from food, talk to your health care provider about whether you should take a daily calcium supplement.  Limit foods that are high in fat and processed sugars, such as fried and sweet foods.  To prevent constipation: ? Drink enough fluid to keep your urine clear or pale yellow. ? Eat foods that are high in fiber, such as fresh fruits and vegetables, whole grains, and beans. Activity  Exercise only as directed by your health care provider. Most women can continue their usual exercise routine during pregnancy. Try to exercise for 30 minutes at least 5 days a week. Stop exercising if you experience uterine contractions.  Avoid heavy lifting, wear low heel shoes, and practice good posture.  A sexual relationship may be continued unless your health care provider directs you otherwise. Relieving pain and discomfort  Wear a good support bra to prevent discomfort from breast tenderness.  Take warm sitz baths to soothe any pain or discomfort caused by hemorrhoids. Use hemorrhoid cream if your health care provider approves.  Rest with your legs elevated if you have leg cramps or low back pain.  If you develop varicose veins, wear support hose. Elevate your feet for 15 minutes, 3-4 times a day. Limit salt in your diet. Prenatal Care  Write down your questions.  Take them to your prenatal visits.  Keep all your prenatal visits as told by your health care provider. This is important. Safety  Wear your seat belt at all times when driving.  Make a list of emergency phone numbers, including numbers for family, friends, the hospital, and police and fire departments. General instructions  Ask your health care provider for a referral to a local prenatal education class. Begin classes no later than the beginning of month 6 of your pregnancy.  Ask for help if you have counseling or nutritional needs during pregnancy. Your health care provider can offer advice or refer you to specialists for help with various needs.  Do not use hot tubs, steam rooms, or saunas.  Do not douche or use tampons or scented sanitary pads.  Do not cross your legs for long periods of time.  Avoid cat litter boxes and soil used by cats. These carry germs that can cause birth defects in the baby and possibly loss of the fetus by miscarriage or stillbirth.  Avoid all smoking, herbs, alcohol, and unprescribed drugs. Chemicals in these products can   affect the formation and growth of the baby.  Do not use any products that contain nicotine or tobacco, such as cigarettes and e-cigarettes. If you need help quitting, ask your health care provider.  Visit your dentist if you have not gone yet during your pregnancy. Use a soft toothbrush to brush your teeth and be gentle when you floss. Contact a health care provider if:  You have dizziness.  You have mild pelvic cramps, pelvic pressure, or nagging pain in the abdominal area.  You have persistent nausea, vomiting, or diarrhea.  You have a bad smelling vaginal discharge.  You have pain when you urinate. Get help right away if:  You have a fever.  You are leaking fluid from your vagina.  You have spotting or bleeding from your vagina.  You have severe abdominal cramping or pain.  You have rapid weight gain or weight  loss.  You have shortness of breath with chest pain.  You notice sudden or extreme swelling of your face, hands, ankles, feet, or legs.  You have not felt your baby move in over an hour.  You have severe headaches that do not go away when you take medicine.  You have vision changes. Summary  The second trimester is from week 14 through week 27 (months 4 through 6). It is also a time when the fetus is growing rapidly.  Your body goes through many changes during pregnancy. The changes vary from woman to woman.  Avoid all smoking, herbs, alcohol, and unprescribed drugs. These chemicals affect the formation and growth your baby.  Do not use any tobacco products, such as cigarettes, chewing tobacco, and e-cigarettes. If you need help quitting, ask your health care provider.  Contact your health care provider if you have any questions. Keep all prenatal visits as told by your health care provider. This is important. This information is not intended to replace advice given to you by your health care provider. Make sure you discuss any questions you have with your health care provider. Document Released: 12/22/2000 Document Revised: 02/03/2016 Document Reviewed: 02/03/2016 Elsevier Interactive Patient Education  2018 [employer]. Pregnancy After Age 33 Women who become pregnant after the age of 45 have a higher risk for certain problems during pregnancy. This is because older women may already have health problems before becoming pregnant. Older women who are healthy before pregnancy may still develop problems during pregnancy. These problems may affect the mother, the unborn baby (fetus), or both. What are the risks for me? If you are over age 50 and you want to become pregnant or are pregnant, you may have a higher risk of:  Not being able to get pregnant (infertility).  Going into labor early (preterm labor).  Needing surgical delivery of your baby (cesarean delivery, or  C-section).  Having high blood pressure (hypertension).  Having complications during pregnancy, such as high blood pressure and other symptoms (preeclampsia).  Having diabetes during pregnancy (gestational diabetes).  Being pregnant with more than one baby.  Loss of the unborn baby before 20 weeks (miscarriage) or after 20 weeks of pregnancy (stillbirth).  What are the risks for my baby? Babies born to women over the age of 50 have a higher risk for:  Being born early (prematurity).  Low birth weight, which is less than 5 lb, 8 oz (2.5 kg).  Birth defects, such as Down syndrome and cleft palate.  Health complications, including problems with growth and development.  How is prenatal care different for women  over age 52? All women should see their health care provider before they try to become pregnant. This is especially important for women over the age of 60. Tell your health care provider about:  Any health problems you have.  Any medicines you take.  Any family history of health problems or chromosome-related defects.  Any problems you have had with past pregnancies or deliveries.  If you are over age 69 and you plan to become pregnant:  Start taking a daily multivitamin a month or more before you try to get pregnant. Your multivitamin should contain 400 mcg (micrograms) of folic acid.  If you are over age 46 and pregnant, make sure you:  Keep taking your multivitamin unless your health care provider tells you not to take it.  Keep all prenatal visits as told by your health care provider. This is important.  Have ultrasounds regularly throughout your pregnancy to check for problems.  Talk with your health care provider about other prenatal screening tests that you may need.  What additional prenatal tests are needed? Screening tests show whether your baby has a higher risk for birth defects than other babies. Screening tests include:  Ultrasound tests to look for  markers that indicate a risk for birth defects.  Maternal blood screening. These are blood tests that measure certain substances in your blood to determine your baby's risk for defects.  Screening tests do not show whether your baby has or does not have defects. They only show your baby's risk for certain defects. If your screening tests show that risk factors are present, you may need tests to confirm the defect (diagnostic testing). These tests may include:  Chorionic villus sampling. For this procedure, a tissue sample is taken from the organ that forms in your uterus to nourish your baby (placenta). The sample is removed through your cervix or abdomen and tested.  Amniocentesis. For this procedure, a small amount of the fluid that surrounds the baby in the uterus (amniotic fluid) is removed and tested.  What can I do to stay healthy during my pregnancy? Staying healthy during pregnancy can help you and your baby to have a lower risk for problems during pregnancy, during delivery, or both. Talk with your health care provider for specific instructions about staying healthy during your pregnancy. Nutrition  At each meal, eat a variety of foods from each of the five food groups. These groups include: ? Proteins such as lean meats, poultry, fish that is low in fat, beans, eggs, and nuts. ? Vegetables such as leafy greens, raw and cooked vegetables, and vegetable juice. ? Fruits that are fresh, frozen, or canned, or 100% fruit juice. ? Dairy products such as low-fat yogurt, cheese, and milk. ? Whole grains including rice, cereal, pasta, and bread.  Talk with your health care provider about how much food in each group is right for you.  Follow instructions from your health care provider about eating and drinking restrictions during pregnancy. ? Do not eat raw eggs, raw meat, or raw fish or seafood. ? Do not eat any fish that contains high amounts of mercury, such as swordfish or  mackerel.  Drink 6-8 or more glasses of water a day. You should drink enough fluid to keep your urine pale yellow. Managing weight gain  Ask your health care provider how much weight gain is healthy during pregnancy.  Stay at a healthy weight. If needed, work with your health care provider to lose weight safely. Activity  Exercise regularly,  as directed by your health care provider. Ask your health care provider what forms of exercise are safe for you. General instructions  Do not use any products that contain nicotine or tobacco, such as cigarettes and e-cigarettes. If you need help quitting, ask your health care provider.  Do not drink alcohol, use drugs, or abuse prescription medicine.  Take over-the-counter and prescription medicines only as told by your health care provider.  Do not use hot tubs, steam rooms, or saunas.  Talk with your health care provider about your risk of exposure to harmful environmental conditions. This includes exposure to chemicals, radiation, cleaning products, and cat feces. Follow advice from your health care provider about how to limit your exposure. Summary  Women who become pregnant after the age of 63 have a higher risk for complications during pregnancy.  Problems may affect the mother, the unborn baby (fetus), or both.  All women should see their health care provider before they try to become pregnant. This is especially important for women over the age of 64.  Staying healthy during pregnancy can help both you and your baby to have a lower risk for some of the problems that can happen during pregnancy, during delivery, or both. This information is not intended to replace advice given to you by your health care provider. Make sure you discuss any questions you have with your health care provider. Document Released: 04/19/2016 Document Revised: 04/19/2016 Document Reviewed: 04/19/2016 Elsevier Interactive Patient Education  2018 [employer].

## 2017-06-03 NOTE — Progress Notes (Signed)
  Subjective:  Alyssa Oneill is a 37 y.o. G2P0010 at [redacted]w[redacted]d being seen today for her first OB visit. EDD by LMP and confirmed by first trimester U/S. No chronic medical problems or medications. H/O first trimester SAB.    She is currently monitored for the following issues for this high-risk pregnancy and has Supervision of other normal pregnancy, antepartum and AMA (advanced maternal age) multigravida 35+ on their problem list.  Patient reports no complaints.  Contractions: Not present. Vag. Bleeding: None.  Movement: Present. Denies leaking of fluid.   The following portions of the patient's history were reviewed and updated as appropriate: allergies, current medications, past family history, past medical history, past social history, past surgical history and problem list. Problem list updated.  Objective:   Vitals:   06/03/17 1113  BP: 100/71  Pulse: 65  Weight: 101 lb 4.8 oz (45.9 kg)    Fetal Status: Fetal Heart Rate (bpm): 158   Movement: Present     General:  Alert, oriented and cooperative. Patient is in no acute distress.  Skin: Skin is warm and dry. No rash noted.   Cardiovascular: Normal heart rate noted  Respiratory: Normal respiratory effort, no problems with respiration noted  Abdomen: Soft, gravid, appropriate for gestational age. Pain/Pressure: Present     Pelvic:  Cervical exam performed        Extremities: Normal range of motion.  Edema: None  Mental Status: Normal mood and affect. Normal behavior. Normal judgment and thought content.   Urinalysis:      Assessment and Plan:  Pregnancy: G2P0010 at [redacted]w[redacted]d  1. Supervision of other normal pregnancy, antepartum Prenatal care and labs reviewed with pt.  Continue with PNV qd - [facility] AMB BABYSCRIPTS SCHEDULE OPTIMIZATION - Culture, OB Urine - Cystic fibrosis gene test - Hemoglobinopathy Evaluation - Obstetric Panel, Including HIV - SMN1 COPY NUMBER ANALYSIS (SMA Carrier Screen) - [place] MFM OB COMP + 14 WK; Future -  Genetic Screening - TSH - Cytology - PAP  2. Multigravida of advanced maternal age in second trimester AMA reviewed with pt. Panorama today - TSH  Preterm labor symptoms and general obstetric precautions including but not limited to vaginal bleeding, contractions, leaking of fluid and fetal movement were reviewed in detail with the patient. Please refer to After Visit Summary for other counseling recommendations.  Return in about 1 month (around 07/01/2017) for OB visit.   Hermina Staggers, MD

## 2017-06-07 ENCOUNTER — Encounter: Payer: Self-pay | Admitting: *Deleted

## 2017-06-07 LAB — CULTURE, OB URINE

## 2017-06-07 LAB — URINE CULTURE, OB REFLEX

## 2017-06-08 LAB — CYTOLOGY - PAP
Chlamydia: NEGATIVE
Diagnosis: NEGATIVE
HPV (WINDOPATH): NOT DETECTED
NEISSERIA GONORRHEA: NEGATIVE

## 2017-06-09 ENCOUNTER — Encounter (HOSPITAL_COMMUNITY): Payer: Self-pay

## 2017-06-10 LAB — OBSTETRIC PANEL, INCLUDING HIV
Antibody Screen: NEGATIVE
BASOS ABS: 0 10*3/uL (ref 0.0–0.2)
Basos: 0 %
EOS (ABSOLUTE): 0.5 10*3/uL — ABNORMAL HIGH (ref 0.0–0.4)
EOS: 4 %
HEMOGLOBIN: 12.7 g/dL (ref 11.1–15.9)
HEP B S AG: NEGATIVE
HIV Screen 4th Generation wRfx: NONREACTIVE
Hematocrit: 37.4 % (ref 34.0–46.6)
IMMATURE GRANS (ABS): 0.1 10*3/uL (ref 0.0–0.1)
IMMATURE GRANULOCYTES: 1 %
Lymphocytes Absolute: 1.8 10*3/uL (ref 0.7–3.1)
Lymphs: 17 %
MCH: 30.3 pg (ref 26.6–33.0)
MCHC: 34 g/dL (ref 31.5–35.7)
MCV: 89 fL (ref 79–97)
MONOCYTES: 5 %
Monocytes Absolute: 0.5 10*3/uL (ref 0.1–0.9)
NEUTROS PCT: 73 %
Neutrophils Absolute: 7.9 10*3/uL — ABNORMAL HIGH (ref 1.4–7.0)
Platelets: 318 10*3/uL (ref 150–450)
RBC: 4.19 x10E6/uL (ref 3.77–5.28)
RDW: 14.6 % (ref 12.3–15.4)
RH TYPE: POSITIVE
RPR: NONREACTIVE
RUBELLA: 2.15 {index} (ref >0.99)
WBC: 10.7 10*3/uL (ref 3.4–10.8)

## 2017-06-10 LAB — SMN1 COPY NUMBER ANALYSIS (SMA CARRIER SCREENING)

## 2017-06-10 LAB — HEMOGLOBINOPATHY EVALUATION
Ferritin: 37 ng/mL (ref 15–150)
HGB A2 QUANT: 2.4 % (ref 1.8–3.2)
HGB A: 97.6 % (ref 96.4–98.8)
HGB C: 0 %
HGB F QUANT: 0 % (ref 0.0–2.0)
HGB S: 0 %
Hgb Solubility: NEGATIVE
Hgb Variant: 0 %

## 2017-06-10 LAB — CYSTIC FIBROSIS GENE TEST

## 2017-06-10 LAB — TSH: TSH: 2.4 u[IU]/mL (ref 0.450–4.500)

## 2017-06-17 ENCOUNTER — Encounter: Payer: Self-pay | Admitting: *Deleted

## 2017-06-17 ENCOUNTER — Other Ambulatory Visit: Payer: Self-pay | Admitting: Obstetrics and Gynecology

## 2017-06-17 ENCOUNTER — Ambulatory Visit (HOSPITAL_COMMUNITY)
Admission: RE | Admit: 2017-06-17 | Discharge: 2017-06-17 | Disposition: A | Discharge status: Home / Self Care | Payer: Medicaid Other | Source: Ambulatory Visit | Attending: Obstetrics and Gynecology | Admitting: Obstetrics and Gynecology

## 2017-06-17 DIAGNOSIS — Z3480 Encounter for supervision of other normal pregnancy, unspecified trimester: Secondary | ICD-10-CM

## 2017-06-17 DIAGNOSIS — O09522 Supervision of elderly multigravida, second trimester: Secondary | ICD-10-CM

## 2017-06-17 DIAGNOSIS — Z3A19 19 weeks gestation of pregnancy: Secondary | ICD-10-CM | POA: Diagnosis not present

## 2017-06-17 DIAGNOSIS — Z363 Encounter for antenatal screening for malformations: Secondary | ICD-10-CM | POA: Diagnosis present

## 2017-07-01 ENCOUNTER — Ambulatory Visit (INDEPENDENT_AMBULATORY_CARE_PROVIDER_SITE_OTHER): Payer: Medicaid Other | Admitting: Medical

## 2017-07-01 VITALS — BP 112/69 | HR 81 | Wt 107.5 lb

## 2017-07-01 DIAGNOSIS — O09522 Supervision of elderly multigravida, second trimester: Secondary | ICD-10-CM

## 2017-07-01 DIAGNOSIS — Z3482 Encounter for supervision of other normal pregnancy, second trimester: Secondary | ICD-10-CM

## 2017-07-01 DIAGNOSIS — O09529 Supervision of elderly multigravida, unspecified trimester: Secondary | ICD-10-CM

## 2017-07-01 DIAGNOSIS — Z3480 Encounter for supervision of other normal pregnancy, unspecified trimester: Secondary | ICD-10-CM

## 2017-07-01 NOTE — Progress Notes (Signed)
   PRENATAL VISIT NOTE  Subjective:  Alyssa Oneill is a 37 y.o. G2P0010 at 5339w0d being seen today for ongoing prenatal care.  She is currently monitored for the following issues for this low-risk pregnancy and has Supervision of other normal pregnancy, antepartum and AMA (advanced maternal age) multigravida 35+ on their problem list.  Patient reports no complaints.  Contractions: Not present. Vag. Bleeding: None.  Movement: Present. Denies leaking of fluid.   The following portions of the patient's history were reviewed and updated as appropriate: allergies, current medications, past family history, past medical history, past social history, past surgical history and problem list. Problem list updated.  Objective:   Vitals:   07/01/17 0904  BP: 112/69  Pulse: 81  Weight: 107 lb 8 oz (48.8 kg)    Fetal Status: Fetal Heart Rate (bpm): 152 Fundal Height: 20 cm Movement: Present     General:  Alert, oriented and cooperative. Patient is in no acute distress.  Skin: Skin is warm and dry. No rash noted.   Cardiovascular: Normal heart rate noted  Respiratory: Normal respiratory effort, no problems with respiration noted  Abdomen: Soft, gravid, appropriate for gestational age.  Pain/Pressure: Present     Pelvic: Cervical exam deferred        Extremities: Normal range of motion.  Edema: None  Mental Status: Normal mood and affect. Normal behavior. Normal judgment and thought content.   Assessment and Plan:  Pregnancy: G2P0010 at 3239w0d  1. Supervision of other normal pregnancy, antepartum - AFP, Serum, Open Spina Bifida  2. Antepartum multigravida of advanced maternal age  Preterm labor/ second trimester symptoms and general obstetric precautions including but not limited to vaginal bleeding, contractions, leaking of fluid and fetal movement were reviewed in detail with the patient. Please refer to After Visit Summary for other counseling recommendations.  Return in about 1 month  (around 07/29/2017) for LOB.  Future Appointments  Date Time Provider Department Center  07/27/2017 10:35 AM Marny LowensteinWenzel, Julie N, PA-C [facility]    Vonzella NippleJulie Wenzel, PA-C

## 2017-07-01 NOTE — Patient Instructions (Addendum)
BENEFITS OF BREASTFEEDING Many women wonder if they should breastfeed. Research shows that breast milk contains the perfect balance of vitamins, protein and fat that your baby needs to grow. It also contains antibodies that help your baby's immune system to fight off viruses and bacteria and can reduce the risk of sudden infant death syndrome (SIDS). In addition, the colostrum (a fluid secreted from the breast in the first few days after delivery) helps your newborn's digestive system to grow and function well. Breast milk is easier to digest than formula. Also, if your baby is born preterm, breast milk can help to reduce both short- and long-term health problems. BENEFITS OF BREASTFEEDING FOR MOM . Breastfeeding causes a hormone to be released that helps the uterus to contract and return to its normal size more quickly. . It aids in postpartum weight loss, reduces risk of breast and ovarian cancer, heart disease and rheumatoid arthritis. . It decreases the amount of bleeding after the baby is born. benefits of breastfeeding for baby . Provides comfort and nutrition . Protects baby against - Obesity - Diabetes - Asthma - Childhood cancers - Heart disease - Ear infections - Diarrhea - Pneumonia - Stomach problems - Serious allergies - Skin rashes . Promotes growth and development . Reduces the risk of baby having Sudden Infant Death Syndrome (SIDS) only breastmilk for the first 6 months . Protects baby against diseases/allergies . It's the perfect amount for tiny bellies . It restores baby's energy . Provides the best nutrition for baby . Giving water or formula can make baby more likely to get sick, decrease Mom's milk supply, make baby less content with breastfeeding Skin to Skin After delivery, the staff will place your baby on your chest. This helps with the following: . Regulates baby's temperature, breathing, heart rate and blood sugar . Increases Mom's milk supply . Promotes  bonding . Keeps baby and Mom calm and decreases baby's crying Rooming In Your baby will stay in your room with you for the entire time you are in the hospital. This helps with the following: . Allows Mom to learn baby's feeding cues - Fluttering eyes - Sucking on tongue or hand - Rooting (opens mouth and turns head) - Nuzzling into the breast - Bringing hand to mouth . Allows breastfeeding on demand (when your baby is ready) . Helps baby to be calm and content . Ensures a good milk supply . Prevents complications with breastfeeding . Allows parents to learn to care for baby . Allows you to request assistance with breastfeeding Importance of a good latch . Increases milk transfer to baby - baby gets enough milk . Ensures you have enough milk for your baby . Decreases nipple soreness . Don't use pacifiers and bottles - these cause baby to suck differently than breastfeeding . Promotes continuation of breastfeeding Risks of Formula Supplementation with Breastfeeding Giving your infant formula in addition to your breast-milk EXCEPT when medically necessary can lead to: . Decreases your milk supply  . Loss of confidence in yourself for providing baby's nutrition  . Engorgement and possibly mastitis  . Asthma & allergies in the baby BREASTFEEDING FAQS How long should I breastfeed my baby? It is recommended that you provide your baby with breast milk only for the first 6 months and then continue for the first year and longer as desired. During the first few weeks after birth, your baby will need to feed 8-12 times every 24 hours, or every 2-3 hours. They will likely feed   for 15-30 minutes. How can I help my baby begin breastfeeding? Babies are born with an instinct to breastfeed. A healthy baby can begin breastfeeding right away without specific help. At the hospital, a nurse (or lactation consultant) will help you begin the process and will give you tips on good positioning. It may be  helpful to take a breastfeeding class before you deliver in order to know what to expect. How can I help my baby latch on? In order to assist your baby in latching-on, cup your breast in your hand and stroke your baby's lower lip with your nipple to stimulate your baby's rooting reflex. Your baby will look like he or she is yawning, at which point you should bring the baby towards your breast, while aiming the nipple at the roof of his or her mouth. Remember to bring the baby towards you and not your breast towards the baby. How can I tell if my baby is latched-on? Your baby will have all of your nipple and part of the dark area around the nipple in his or her mouth and your baby's nose will be touching your breast. You should see or hear the baby swallowing. If the baby is not latched-on properly, start the process over. To remove the suction, insert a clean finger between your breast and the baby's mouth. Should I switch breasts during feeding? After feeding on one side, switch the baby to your other breast. If he or she does not continue feeding - that is OK. Your baby will not necessarily need to feed from both breasts in a single feeding. On the next feeding, start with the other breast for efficiency and comfort. How can I tell if my baby is hungry? When your baby is hungry, they will nuzzle against your breast, make sucking noises and tongue motions and may put their hands near their mouth. Crying is a late sign of hunger, so you should not wait until this point. When they have received enough milk, they will unlatch from the breast. Is it okay to use a pacifier? Until your baby gets the hang of breastfeeding, experts recommend limiting pacifier usage. If you have questions about this, please contact your pediatrician. What can I do to ensure proper nutrition while breastfeeding? . Make sure that you support your own health and your baby's by eating a healthy, well-balanced diet . Your provider  may recommend that you continue to take your prenatal vitamin . Drink plenty of fluids. It is a good rule to drink one glass of water before or after feeding . Alcohol will remain in the breast milk for as long as it will remain in the blood stream. If you choose to have a drink, it is recommended that you wait at least 2 hours before feeding . Moderate amounts of caffeine are OK . Some over-the-counter or prescription medications are not recommended during breastfeeding. Check with your provider if you have questions What types of birth control methods are safe while breastfeeding? Progestin-only methods, including a daily pill, an IUD, the implant and the injection are safe while breastfeeding. Methods that contain estrogen (such as combination birth control pills, the vaginal ring and the patch) should not be used during the first month of breastfeeding as these can decrease your milk supply.  Second Trimester of Pregnancy The second trimester is from week 13 through week 28, month 4 through 6. This is often the time in pregnancy that you feel your best. Often times, morning sickness  has lessened or quit. You may have more energy, and you may get hungry more often. Your unborn baby (fetus) is growing rapidly. At the end of the sixth month, he or she is about 9 inches long and weighs about 1 pounds. You will likely feel the baby move (quickening) between 18 and 20 weeks of pregnancy.  Research childbirth classes and hospital preregistration at [email or web address]  Follow these instructions at home:  Avoid all smoking, herbs, and alcohol. Avoid drugs not approved by your doctor.  Do not use any tobacco products, including cigarettes, chewing tobacco, and electronic cigarettes. If you need help quitting, ask your doctor. You may get counseling or other support to help you quit.  Only take medicine as told by your doctor. Some medicines are safe and some are not during pregnancy.  Exercise only  as told by your doctor. Stop exercising if you start having cramps.  Eat regular, healthy meals.  Wear a good support bra if your breasts are tender.  Do not use hot tubs, steam rooms, or saunas.  Wear your seat belt when driving.  Avoid raw meat, uncooked cheese, and liter boxes and soil used by cats.  Take your prenatal vitamins.  Take 1500-2000 milligrams of calcium daily starting at the 20th week of pregnancy until you deliver your baby.  Try taking medicine that helps you poop (stool softener) as needed, and if your doctor approves. Eat more fiber by eating fresh fruit, vegetables, and whole grains. Drink enough fluids to keep your pee (urine) clear or pale yellow.  Take warm water baths (sitz baths) to soothe pain or discomfort caused by hemorrhoids. Use hemorrhoid cream if your doctor approves.  If you have puffy, bulging veins (varicose veins), wear support hose. Raise (elevate) your feet for 15 minutes, 3-4 times a day. Limit salt in your diet.  Avoid heavy lifting, wear low heals, and sit up straight.  Rest with your legs raised if you have leg cramps or low back pain.  Visit your dentist if you have not gone during your pregnancy. Use a soft toothbrush to brush your teeth. Be gentle when you floss.  You can have sex (intercourse) unless your doctor tells you not to.  Go to your doctor visits.  Get help if:  You feel dizzy.  You have mild cramps or pressure in your lower belly (abdomen).  You have a nagging pain in your belly area.  You continue to feel sick to your stomach (nauseous), throw up (vomit), or have watery poop (diarrhea).  You have bad smelling fluid coming from your vagina.  You have pain with peeing (urination). Get help right away if:  You have a fever.  You are leaking fluid from your vagina.  You have spotting or bleeding from your vagina.  You have severe belly cramping or pain.  You lose or gain weight rapidly.  You have trouble  catching your breath and have chest pain.  You notice sudden or extreme puffiness (swelling) of your face, hands, ankles, feet, or legs.  You have not felt the baby move in over an hour.  You have severe headaches that do not go away with medicine.  You have vision changes. This information is not intended to replace advice given to you by your health care provider. Make sure you discuss any questions you have with your health care provider. Document Released: 03/24/2009 Document Revised: 06/05/2015 Document Reviewed: 02/29/2012 Elsevier Interactive Patient Education  2017 [employer]

## 2017-07-03 LAB — AFP, SERUM, OPEN SPINA BIFIDA
AFP MoM: 2.29
AFP Value: 182.3 ng/mL
GEST. AGE ON COLLECTION DATE: 21 wk
Maternal Age At EDD: 37.8 yr
OSBR RISK 1 IN: 435
Test Results:: NEGATIVE
WEIGHT: 107 [lb_av]

## 2017-07-27 ENCOUNTER — Ambulatory Visit (INDEPENDENT_AMBULATORY_CARE_PROVIDER_SITE_OTHER): Payer: Medicaid Other | Admitting: Medical

## 2017-07-27 ENCOUNTER — Encounter: Payer: Self-pay | Admitting: Medical

## 2017-07-27 VITALS — BP 103/65 | HR 70 | Wt 112.1 lb

## 2017-07-27 DIAGNOSIS — Z3480 Encounter for supervision of other normal pregnancy, unspecified trimester: Secondary | ICD-10-CM

## 2017-07-27 DIAGNOSIS — O09529 Supervision of elderly multigravida, unspecified trimester: Secondary | ICD-10-CM

## 2017-07-27 NOTE — Patient Instructions (Signed)
Second Trimester of Pregnancy The second trimester is from week 13 through week 28, month 4 through 6. This is often the time in pregnancy that you feel your best. Often times, morning sickness has lessened or quit. You may have more energy, and you may get hungry more often. Your unborn baby (fetus) is growing rapidly. At the end of the sixth month, he or she is about 9 inches long and weighs about 1 pounds. You will likely feel the baby move (quickening) between 18 and 20 weeks of pregnancy.  Research childbirth classes and hospital preregistration at [email or web address]  Follow these instructions at home:  Avoid all smoking, herbs, and alcohol. Avoid drugs not approved by your doctor.  Do not use any tobacco products, including cigarettes, chewing tobacco, and electronic cigarettes. If you need help quitting, ask your doctor. You may get counseling or other support to help you quit.  Only take medicine as told by your doctor. Some medicines are safe and some are not during pregnancy.  Exercise only as told by your doctor. Stop exercising if you start having cramps.  Eat regular, healthy meals.  Wear a good support bra if your breasts are tender.  Do not use hot tubs, steam rooms, or saunas.  Wear your seat belt when driving.  Avoid raw meat, uncooked cheese, and liter boxes and soil used by cats.  Take your prenatal vitamins.  Take 1500-2000 milligrams of calcium daily starting at the 20th week of pregnancy until you deliver your baby.  Try taking medicine that helps you poop (stool softener) as needed, and if your doctor approves. Eat more fiber by eating fresh fruit, vegetables, and whole grains. Drink enough fluids to keep your pee (urine) clear or pale yellow.  Take warm water baths (sitz baths) to soothe pain or discomfort caused by hemorrhoids. Use hemorrhoid cream if your doctor approves.  If you have puffy, bulging veins (varicose veins), wear support hose. Raise  (elevate) your feet for 15 minutes, 3-4 times a day. Limit salt in your diet.  Avoid heavy lifting, wear low heals, and sit up straight.  Rest with your legs raised if you have leg cramps or low back pain.  Visit your dentist if you have not gone during your pregnancy. Use a soft toothbrush to brush your teeth. Be gentle when you floss.  You can have sex (intercourse) unless your doctor tells you not to.  Go to your doctor visits.  Get help if:  You feel dizzy.  You have mild cramps or pressure in your lower belly (abdomen).  You have a nagging pain in your belly area.  You continue to feel sick to your stomach (nauseous), throw up (vomit), or have watery poop (diarrhea).  You have bad smelling fluid coming from your vagina.  You have pain with peeing (urination). Get help right away if:  You have a fever.  You are leaking fluid from your vagina.  You have spotting or bleeding from your vagina.  You have severe belly cramping or pain.  You lose or gain weight rapidly.  You have trouble catching your breath and have chest pain.  You notice sudden or extreme puffiness (swelling) of your face, hands, ankles, feet, or legs.  You have not felt the baby move in over an hour.  You have severe headaches that do not go away with medicine.  You have vision changes. This information is not intended to replace advice given to you by your health care provider. Make   sure you discuss any questions you have with your health care provider. Document Released: 03/24/2009 Document Revised: 06/05/2015 Document Reviewed: 02/29/2012 Elsevier Interactive Patient Education  2017 [employer]vier Inc.    

## 2017-07-27 NOTE — Progress Notes (Signed)
   PRENATAL VISIT NOTE  Subjective:  Alyssa Oneill is a 37 y.o. G2P0010 at 6254w5d being seen today for ongoing prenatal care.  She is currently monitored for the following issues for this low-risk pregnancy and has Supervision of other normal pregnancy, antepartum and AMA (advanced maternal age) multigravida 35+ on their problem list.  Patient reports no complaints.  Contractions: Irritability. Vag. Bleeding: None.  Movement: Present. Denies leaking of fluid.   The following portions of the patient's history were reviewed and updated as appropriate: allergies, current medications, past family history, past medical history, past social history, past surgical history and problem list. Problem list updated.  Objective:   Vitals:   07/27/17 1122  BP: 103/65  Pulse: 70  Weight: 112 lb 1.6 oz (50.8 kg)    Fetal Status: Fetal Heart Rate (bpm): 158 Fundal Height: 24 cm Movement: Present     General:  Alert, oriented and cooperative. Patient is in no acute distress.  Skin: Skin is warm and dry. No rash noted.   Cardiovascular: Normal heart rate noted  Respiratory: Normal respiratory effort, no problems with respiration noted  Abdomen: Soft, gravid, appropriate for gestational age.  Pain/Pressure: Present     Pelvic: Cervical exam deferred        Extremities: Normal range of motion.  Edema: None  Mental Status: Normal mood and affect. Normal behavior. Normal judgment and thought content.   Assessment and Plan:  Pregnancy: G2P0010 at 5454w5d  1. Supervision of other normal pregnancy, antepartum - Doing well - All questions answered  2. Antepartum multigravida of advanced maternal age - < 40 years, no change in management   Preterm labor/second trimester symptoms and general obstetric precautions including but not limited to vaginal bleeding, contractions, leaking of fluid and fetal movement were reviewed in detail with the patient. Please refer to After Visit Summary for other counseling  recommendations.  Return in about 1 month (around 08/24/2017) for LOB, 28 week labs (fasting).  Future Appointments  Date Time Provider Department Center  08/24/2017  8:20 AM [facility]  08/24/2017  9:15 AM Burleson, Brand Maleserri L, NP [facility]    Vonzella NippleJulie Anysha Frappier, PA-C

## 2017-08-23 ENCOUNTER — Other Ambulatory Visit: Payer: Self-pay

## 2017-08-23 DIAGNOSIS — Z3480 Encounter for supervision of other normal pregnancy, unspecified trimester: Secondary | ICD-10-CM

## 2017-08-24 ENCOUNTER — Encounter: Payer: Self-pay | Admitting: Medical

## 2017-08-24 ENCOUNTER — Ambulatory Visit (INDEPENDENT_AMBULATORY_CARE_PROVIDER_SITE_OTHER): Payer: Medicaid Other | Admitting: Medical

## 2017-08-24 ENCOUNTER — Other Ambulatory Visit: Payer: Medicaid Other

## 2017-08-24 VITALS — BP 105/75 | HR 82 | Wt 117.6 lb

## 2017-08-24 DIAGNOSIS — Z23 Encounter for immunization: Secondary | ICD-10-CM

## 2017-08-24 DIAGNOSIS — O09529 Supervision of elderly multigravida, unspecified trimester: Secondary | ICD-10-CM

## 2017-08-24 DIAGNOSIS — Z3480 Encounter for supervision of other normal pregnancy, unspecified trimester: Secondary | ICD-10-CM | POA: Diagnosis not present

## 2017-08-24 DIAGNOSIS — O09523 Supervision of elderly multigravida, third trimester: Secondary | ICD-10-CM

## 2017-08-24 NOTE — Patient Instructions (Signed)
Research childbirth classes and hospital preregistration at [email or web address]  Fetal Movement Counts Patient Name: ________________________________________________ Patient Due Date: ____________________ What is a fetal movement count? A fetal movement count is the number of times that you feel your baby move during a certain amount of time. This may also be called a fetal kick count. A fetal movement count is recommended for every pregnant woman. You may be asked to start counting fetal movements as early as week 28 of your pregnancy. Pay attention to when your baby is most active. You may notice your baby's sleep and wake cycles. You may also notice things that make your baby move more. You should do a fetal movement count:  When your baby is normally most active.  At the same time each day.  A good time to count movements is while you are resting, after having something to eat and drink. How do I count fetal movements? 1. Find a quiet, comfortable area. Sit, or lie down on your side. 2. Write down the date, the start time and stop time, and the number of movements that you felt between those two times. Take this information with you to your health care visits. 3. For 2 hours, count kicks, flutters, swishes, rolls, and jabs. You should feel at least 10 movements during 2 hours. 4. You may stop counting after you have felt 10 movements. 5. If you do not feel 10 movements in 2 hours, have something to eat and drink. Then, keep resting and counting for 1 hour. If you feel at least 4 movements during that hour, you may stop counting. Contact a health care provider if:  You feel fewer than 4 movements in 2 hours.  Your baby is not moving like he or she usually does. Date: ____________ Start time: ____________ Stop time: ____________ Movements: ____________ Date: ____________ Start time: ____________ Stop time: ____________ Movements: ____________ Date: ____________ Start time: ____________  Stop time: ____________ Movements: ____________ Date: ____________ Start time: ____________ Stop time: ____________ Movements: ____________ Date: ____________ Start time: ____________ Stop time: ____________ Movements: ____________ Date: ____________ Start time: ____________ Stop time: ____________ Movements: ____________ Date: ____________ Start time: ____________ Stop time: ____________ Movements: ____________ Date: ____________ Start time: ____________ Stop time: ____________ Movements: ____________ Date: ____________ Start time: ____________ Stop time: ____________ Movements: ____________ This information is not intended to replace advice given to you by your health care provider. Make sure you discuss any questions you have with your health care provider. Document Released: 01/27/2006 Document Revised: 08/27/2015 Document Reviewed: 02/06/2015 Elsevier Interactive Patient Education  2018 [employer] Inc[employer]  Braxton Hicks Contractions Contractions of the uterus can occur throughout pregnancy, but they are not always a sign that you are in labor. You may have practice contractions called Braxton Hicks contractions. These false labor contractions are sometimes confused with true labor. What are Braxton Hicks contractions? Braxton Hicks contractions are tightening movements that occur in the muscles of the uterus before labor. Unlike true labor contractions, these contractions do not result in opening (dilation) and thinning of the cervix. Toward the end of pregnancy (32-34 weeks), Braxton Hicks contractions can happen more often and may become stronger. These contractions are sometimes difficult to tell apart from true labor because they can be very uncomfortable. You should not feel embarrassed if you go to the hospital with false labor. Sometimes, the only way to tell if you are in true labor is for your health care provider to look for changes in the cervix. The health care provider will   do a physical  exam and may monitor your contractions. If you are not in true labor, the exam should show that your cervix is not dilating and your water has not broken. If there are other health problems associated with your pregnancy, it is completely safe for you to be sent home with false labor. You may continue to have Braxton Hicks contractions until you go into true labor. How to tell the difference between true labor and false labor True labor  Contractions last 30-70 seconds.  Contractions become very regular.  Discomfort is usually felt in the top of the uterus, and it spreads to the lower abdomen and low back.  Contractions do not go away with walking.  Contractions usually become more intense and increase in frequency.  The cervix dilates and gets thinner. False labor  Contractions are usually shorter and not as strong as true labor contractions.  Contractions are usually irregular.  Contractions are often felt in the front of the lower abdomen and in the groin.  Contractions may go away when you walk around or change positions while lying down.  Contractions get weaker and are shorter-lasting as time goes on.  The cervix usually does not dilate or become thin. Follow these instructions at home:  Take over-the-counter and prescription medicines only as told by your health care provider.  Keep up with your usual exercises and follow other instructions from your health care provider.  Eat and drink lightly if you think you are going into labor.  If Braxton Hicks contractions are making you uncomfortable: ? Change your position from lying down or resting to walking, or change from walking to resting. ? Sit and rest in a tub of warm water. ? Drink enough fluid to keep your urine pale yellow. Dehydration may cause these contractions. ? Do slow and deep breathing several times an hour.  Keep all follow-up prenatal visits as told by your health care provider. This is  important. Contact a health care provider if:  You have a fever.  You have continuous pain in your abdomen. Get help right away if:  Your contractions become stronger, more regular, and closer together.  You have fluid leaking or gushing from your vagina.  You pass blood-tinged mucus (bloody show).  You have bleeding from your vagina.  You have low back pain that you never had before.  You feel your baby's head pushing down and causing pelvic pressure.  Your baby is not moving inside you as much as it used to. Summary  Contractions that occur before labor are called Braxton Hicks contractions, false labor, or practice contractions.  Braxton Hicks contractions are usually shorter, weaker, farther apart, and less regular than true labor contractions. True labor contractions usually become progressively stronger and regular and they become more frequent.  Manage discomfort from Braxton Hicks contractions by changing position, resting in a warm bath, drinking plenty of water, or practicing deep breathing. This information is not intended to replace advice given to you by your health care provider. Make sure you discuss any questions you have with your health care provider. Document Released: 05/13/2016 Document Revised: 05/13/2016 Document Reviewed: 05/13/2016 Elsevier Interactive Patient Education  2018 [employer] Inc.    

## 2017-08-24 NOTE — Progress Notes (Signed)
Feels like baby active ; maybe not as much yesterday as last week; but fine today. C/o braxton hicks uc's every other day.

## 2017-08-24 NOTE — Progress Notes (Signed)
   PRENATAL VISIT NOTE  Subjective:  Alyssa Oneill is a 37 y.o. G2P0010 at 4237w5d being seen today for ongoing prenatal care.  She is currently monitored for the following issues for this low-risk pregnancy and has Supervision of other normal pregnancy, antepartum and AMA (advanced maternal age) multigravida 35+ on their problem list.  Patient reports occasional contractions.  Contractions: Irregular. Vag. Bleeding: None.  Movement: Present. Denies leaking of fluid.   The following portions of the patient's history were reviewed and updated as appropriate: allergies, current medications, past family history, past medical history, past social history, past surgical history and problem list. Problem list updated.  Objective:   Vitals:   08/24/17 0841  BP: 105/75  Pulse: 82  Weight: 117 lb 9.6 oz (53.3 kg)    Fetal Status: Fetal Heart Rate (bpm): 141 Fundal Height: 29 cm Movement: Present     General:  Alert, oriented and cooperative. Patient is in no acute distress.  Skin: Skin is warm and dry. No rash noted.   Cardiovascular: Normal heart rate noted  Respiratory: Normal respiratory effort, no problems with respiration noted  Abdomen: Soft, gravid, appropriate for gestational age.  Pain/Pressure: Absent     Pelvic: Cervical exam deferred        Extremities: Normal range of motion.  Edema: Trace  Mental Status: Normal mood and affect. Normal behavior. Normal judgment and thought content.   Assessment and Plan:  Pregnancy: G2P0010 at 6637w5d  1. Supervision of other normal pregnancy, antepartum - 2 hour GTT, CBC, HIV and RPR today  - Tdap vaccine greater than or equal to 7yo IM - Discussed appropriate weight gain at patients request - Discussed appropriate travel restrictions for third trimester - Discussed kick counts and braxton hicks contractions    2. Antepartum multigravida of advanced maternal age - < 38 y.o. No change in management  Preterm labor symptoms and general  obstetric precautions including but not limited to vaginal bleeding, contractions, leaking of fluid and fetal movement were reviewed in detail with the patient. Please refer to After Visit Summary for other counseling recommendations.  Return in about 2 weeks (around 09/07/2017) for LOB.  Future Appointments  Date Time Provider Department Center  09/14/2017  8:55 AM Rolm BookbinderNeill, Caroline M, [facility]M [facility]    Vonzella NippleJulie Lavin Petteway, PA-C

## 2017-08-25 LAB — CBC
HEMATOCRIT: 36.9 % (ref 34.0–46.6)
Hemoglobin: 12 g/dL (ref 11.1–15.9)
MCH: 30.9 pg (ref 26.6–33.0)
MCHC: 32.5 g/dL (ref 31.5–35.7)
MCV: 95 fL (ref 79–97)
PLATELETS: 294 10*3/uL (ref 150–450)
RBC: 3.88 x10E6/uL (ref 3.77–5.28)
RDW: 13.2 % (ref 12.3–15.4)
WBC: 10.5 10*3/uL (ref 3.4–10.8)

## 2017-08-25 LAB — GLUCOSE TOLERANCE, 2 HOURS W/ 1HR
GLUCOSE, FASTING: 69 mg/dL (ref 65–91)
Glucose, 1 hour: 91 mg/dL (ref 65–179)
Glucose, 2 hour: 61 mg/dL — ABNORMAL LOW (ref 65–152)

## 2017-08-25 LAB — RPR: RPR Ser Ql: NONREACTIVE

## 2017-08-25 LAB — HIV ANTIBODY (ROUTINE TESTING W REFLEX): HIV SCREEN 4TH GENERATION: NONREACTIVE

## 2017-09-07 ENCOUNTER — Ambulatory Visit (INDEPENDENT_AMBULATORY_CARE_PROVIDER_SITE_OTHER): Payer: Medicaid Other | Admitting: Advanced Practice Midwife

## 2017-09-07 ENCOUNTER — Encounter: Payer: Medicaid Other | Admitting: Student

## 2017-09-07 VITALS — BP 104/75 | HR 70 | Wt 120.6 lb

## 2017-09-07 DIAGNOSIS — Z3480 Encounter for supervision of other normal pregnancy, unspecified trimester: Secondary | ICD-10-CM

## 2017-09-07 NOTE — Patient Instructions (Addendum)
Resources for Breastfeeding 1.  Www.kellymom.com 2.  Twistzilla.es ([employer])  Pregnancy Resource 1.  Www.lamaze.com    Third Trimester of Pregnancy The third trimester is from week 28 through week 40 (months 7 through 9). The third trimester is a time when the unborn baby (fetus) is growing rapidly. At the end of the ninth month, the fetus is about 20 inches in length and weighs 6-10 pounds. Body changes during your third trimester Your body will continue to go through many changes during pregnancy. The changes vary from woman to woman. During the third trimester:  Your weight will continue to increase. You can expect to gain 25-35 pounds (11-16 kg) by the end of the pregnancy.  You may begin to get stretch marks on your hips, abdomen, and breasts.  You may urinate more often because the fetus is moving lower into your pelvis and pressing on your bladder.  You may develop or continue to have heartburn. This is caused by increased hormones that slow down muscles in the digestive tract.  You may develop or continue to have constipation because increased hormones slow digestion and cause the muscles that push waste through your intestines to relax.  You may develop hemorrhoids. These are swollen veins (varicose veins) in the rectum that can itch or be painful.  You may develop swollen, bulging veins (varicose veins) in your legs.  You may have increased body aches in the pelvis, back, or thighs. This is due to weight gain and increased hormones that are relaxing your joints.  You may have changes in your hair. These can include thickening of your hair, rapid growth, and changes in texture. Some women also have hair loss during or after pregnancy, or hair that feels dry or thin. Your hair will most likely return to normal after your baby is born.  Your breasts will continue to grow and they will continue to become tender. A yellow fluid (colostrum) may leak from your breasts. This  is the first milk you are producing for your baby.  Your belly button may stick out.  You may notice more swelling in your hands, face, or ankles.  You may have increased tingling or numbness in your hands, arms, and legs. The skin on your belly may also feel numb.  You may feel short of breath because of your expanding uterus.  You may have more problems sleeping. This can be caused by the size of your belly, increased need to urinate, and an increase in your body's metabolism.  You may notice the fetus "dropping," or moving lower in your abdomen (lightening).  You may have increased vaginal discharge.  You may notice your joints feel loose and you may have pain around your pelvic bone.  What to expect at prenatal visits You will have prenatal exams every 2 weeks until week 36. Then you will have weekly prenatal exams. During a routine prenatal visit:  You will be weighed to make sure you and the baby are growing normally.  Your blood pressure will be taken.  Your abdomen will be measured to track your baby's growth.  The fetal heartbeat will be listened to.  Any test results from the previous visit will be discussed.  You may have a cervical check near your due date to see if your cervix has softened or thinned (effaced).  You will be tested for Group B streptococcus. This happens between 35 and 37 weeks.  Your health care provider may ask you:  What your  birth plan is.  How you are feeling.  If you are feeling the baby move.  If you have had any abnormal symptoms, such as leaking fluid, bleeding, severe headaches, or abdominal cramping.  If you are using any tobacco products, including cigarettes, chewing tobacco, and electronic cigarettes.  If you have any questions.  Other tests or screenings that may be performed during your third trimester include:  Blood tests that check for low iron levels (anemia).  Fetal testing to check the health, activity level, and  growth of the fetus. Testing is done if you have certain medical conditions or if there are problems during the pregnancy.  Nonstress test (NST). This test checks the health of your baby to make sure there are no signs of problems, such as the baby not getting enough oxygen. During this test, a belt is placed around your belly. The baby is made to move, and its heart rate is monitored during movement.  What is false labor? False labor is a condition in which you feel small, irregular tightenings of the muscles in the womb (contractions) that usually go away with rest, changing position, or drinking water. These are called Braxton Hicks contractions. Contractions may last for hours, days, or even weeks before true labor sets in. If contractions come at regular intervals, become more frequent, increase in intensity, or become painful, you should see your health care provider. What are the signs of labor?  Abdominal cramps.  Regular contractions that start at 10 minutes apart and become stronger and more frequent with time.  Contractions that start on the top of the uterus and spread down to the lower abdomen and back.  Increased pelvic pressure and dull back pain.  A watery or bloody mucus discharge that comes from the vagina.  Leaking of amniotic fluid. This is also known as your "water breaking." It could be a slow trickle or a gush. Let your health care provider know if it has a color or strange odor. If you have any of these signs, call your health care provider right away, even if it is before your due date. Follow these instructions at home: Medicines  Follow your health care provider's instructions regarding medicine use. Specific medicines may be either safe or unsafe to take during pregnancy.  Take a prenatal vitamin that contains at least 600 micrograms (mcg) of folic acid.  If you develop constipation, try taking a stool softener if your health care provider approves. Eating and  drinking  Eat a balanced diet that includes fresh fruits and vegetables, whole grains, good sources of protein such as meat, eggs, or tofu, and low-fat dairy. Your health care provider will help you determine the amount of weight gain that is right for you.  Avoid raw meat and uncooked cheese. These carry germs that can cause birth defects in the baby.  If you have low calcium intake from food, talk to your health care provider about whether you should take a daily calcium supplement.  Eat four or five small meals rather than three large meals a day.  Limit foods that are high in fat and processed sugars, such as fried and sweet foods.  To prevent constipation: ? Drink enough fluid to keep your urine clear or pale yellow. ? Eat foods that are high in fiber, such as fresh fruits and vegetables, whole grains, and beans. Activity  Exercise only as directed by your health care provider. Most women can continue their usual exercise routine during pregnancy. Try  to exercise for 30 minutes at least 5 days a week. Stop exercising if you experience uterine contractions.  Avoid heavy lifting.  Do not exercise in extreme heat or humidity, or at high altitudes.  Wear low-heel, comfortable shoes.  Practice good posture.  You may continue to have sex unless your health care provider tells you otherwise. Relieving pain and discomfort  Take frequent breaks and rest with your legs elevated if you have leg cramps or low back pain.  Take warm sitz baths to soothe any pain or discomfort caused by hemorrhoids. Use hemorrhoid cream if your health care provider approves.  Wear a good support bra to prevent discomfort from breast tenderness.  If you develop varicose veins: ? Wear support pantyhose or compression stockings as told by your healthcare provider. ? Elevate your feet for 15 minutes, 3-4 times a day. Prenatal care  Write down your questions. Take them to your prenatal visits.  Keep all  your prenatal visits as told by your health care provider. This is important. Safety  Wear your seat belt at all times when driving.  Make a list of emergency phone numbers, including numbers for family, friends, the hospital, and police and fire departments. General instructions  Avoid cat litter boxes and soil used by cats. These carry germs that can cause birth defects in the baby. If you have a cat, ask someone to clean the litter box for you.  Do not travel far distances unless it is absolutely necessary and only with the approval of your health care provider.  Do not use hot tubs, steam rooms, or saunas.  Do not drink alcohol.  Do not use any products that contain nicotine or tobacco, such as cigarettes and e-cigarettes. If you need help quitting, ask your health care provider.  Do not use any medicinal herbs or unprescribed drugs. These chemicals affect the formation and growth of the baby.  Do not douche or use tampons or scented sanitary pads.  Do not cross your legs for long periods of time.  To prepare for the arrival of your baby: ? Take prenatal classes to understand, practice, and ask questions about labor and delivery. ? Make a trial run to the hospital. ? Visit the hospital and tour the maternity area. ? Arrange for maternity or paternity leave through employers. ? Arrange for family and friends to take care of pets while you are in the hospital. ? Purchase a rear-facing car seat and make sure you know how to install it in your car. ? Pack your hospital bag. ? Prepare the baby's nursery. Make sure to remove all pillows and stuffed animals from the baby's crib to prevent suffocation.  Visit your dentist if you have not gone during your pregnancy. Use a soft toothbrush to brush your teeth and be gentle when you floss. Contact a health care provider if:  You are unsure if you are in labor or if your water has broken.  You become dizzy.  You have mild pelvic  cramps, pelvic pressure, or nagging pain in your abdominal area.  You have lower back pain.  You have persistent nausea, vomiting, or diarrhea.  You have an unusual or bad smelling vaginal discharge.  You have pain when you urinate. Get help right away if:  Your water breaks before 37 weeks.  You have regular contractions less than 5 minutes apart before 37 weeks.  You have a fever.  You are leaking fluid from your vagina.  You have spotting or  bleeding from your vagina.  You have severe abdominal pain or cramping.  You have rapid weight loss or weight gain.  You have shortness of breath with chest pain.  You notice sudden or extreme swelling of your face, hands, ankles, feet, or legs.  Your baby makes fewer than 10 movements in 2 hours.  You have severe headaches that do not go away when you take medicine.  You have vision changes. Summary  The third trimester is from week 28 through week 40, months 7 through 9. The third trimester is a time when the unborn baby (fetus) is growing rapidly.  During the third trimester, your discomfort may increase as you and your baby continue to gain weight. You may have abdominal, leg, and back pain, sleeping problems, and an increased need to urinate.  During the third trimester your breasts will keep growing and they will continue to become tender. A yellow fluid (colostrum) may leak from your breasts. This is the first milk you are producing for your baby.  False labor is a condition in which you feel small, irregular tightenings of the muscles in the womb (contractions) that eventually go away. These are called Braxton Hicks contractions. Contractions may last for hours, days, or even weeks before true labor sets in.  Signs of labor can include: abdominal cramps; regular contractions that start at 10 minutes apart and become stronger and more frequent with time; watery or bloody mucus discharge that comes from the vagina; increased  pelvic pressure and dull back pain; and leaking of amniotic fluid. This information is not intended to replace advice given to you by your health care provider. Make sure you discuss any questions you have with your health care provider. Document Released: 12/22/2000 Document Revised: 06/05/2015 Document Reviewed: 02/29/2012 Elsevier Interactive Patient Education  2017 [employer].

## 2017-09-07 NOTE — Progress Notes (Signed)
   PRENATAL VISIT NOTE  Subjective:  Alyssa Oneill is a 37 y.o. G2P0010 at 7555w5d being seen today for ongoing prenatal care.  She is currently monitored for the following issues for this low-risk pregnancy and has Supervision of other normal pregnancy, antepartum and AMA (advanced maternal age) multigravida 35+ on their problem list.  Patient reports no complaints.  Contractions: Irritability. Vag. Bleeding: None.  Movement: Present. Denies leaking of fluid.   The following portions of the patient's history were reviewed and updated as appropriate: allergies, current medications, past family history, past medical history, past social history, past surgical history and problem list. Problem list updated.  Objective:   Vitals:   09/07/17 0915  BP: 104/75  Pulse: 70  Weight: 54.7 kg    Fetal Status: Fetal Heart Rate (bpm): 143   Movement: Present     General:  Alert, oriented and cooperative. Patient is in no acute distress.  Skin: Skin is warm and dry. No rash noted.   Cardiovascular: Normal heart rate noted  Respiratory: Normal respiratory effort, no problems with respiration noted  Abdomen: Soft, gravid, appropriate for gestational age.  Pain/Pressure: Present     Pelvic: Cervical exam deferred        Extremities: Normal range of motion.  Edema: None  Mental Status: Normal mood and affect. Normal behavior. Normal judgment and thought content.   Assessment and Plan:  Pregnancy: G2P0010 at 7255w5d  1. Supervision of other normal pregnancy, antepartum --Anticipatory guidance about next visits/weeks of pregnancy given. --Questions answered about prenatal and breastfeeding classes.    Preterm labor symptoms and general obstetric precautions including but not limited to vaginal bleeding, contractions, leaking of fluid and fetal movement were reviewed in detail with the patient. Please refer to After Visit Summary for other counseling recommendations.  No follow-ups on file.  No  future appointments.  Sharen CounterLisa Leftwich-Kirby, CNM

## 2017-09-22 ENCOUNTER — Inpatient Hospital Stay (HOSPITAL_COMMUNITY)
Admission: AD | Admit: 2017-09-22 | Discharge: 2017-09-22 | Disposition: A | Discharge status: Home / Self Care | Payer: Medicaid Other | Source: Ambulatory Visit | Attending: Obstetrics & Gynecology | Admitting: Obstetrics & Gynecology

## 2017-09-22 ENCOUNTER — Encounter (HOSPITAL_COMMUNITY): Payer: Self-pay | Admitting: *Deleted

## 2017-09-22 ENCOUNTER — Ambulatory Visit (INDEPENDENT_AMBULATORY_CARE_PROVIDER_SITE_OTHER): Payer: Medicaid Other | Admitting: Advanced Practice Midwife

## 2017-09-22 VITALS — BP 115/78 | HR 77 | Wt 121.5 lb

## 2017-09-22 DIAGNOSIS — Z87891 Personal history of nicotine dependence: Secondary | ICD-10-CM | POA: Diagnosis not present

## 2017-09-22 DIAGNOSIS — Z79899 Other long term (current) drug therapy: Secondary | ICD-10-CM | POA: Insufficient documentation

## 2017-09-22 DIAGNOSIS — Z886 Allergy status to analgesic agent status: Secondary | ICD-10-CM | POA: Insufficient documentation

## 2017-09-22 DIAGNOSIS — O26853 Spotting complicating pregnancy, third trimester: Secondary | ICD-10-CM

## 2017-09-22 DIAGNOSIS — R109 Unspecified abdominal pain: Secondary | ICD-10-CM | POA: Diagnosis present

## 2017-09-22 DIAGNOSIS — O09523 Supervision of elderly multigravida, third trimester: Secondary | ICD-10-CM | POA: Diagnosis not present

## 2017-09-22 DIAGNOSIS — Z3483 Encounter for supervision of other normal pregnancy, third trimester: Secondary | ICD-10-CM | POA: Diagnosis not present

## 2017-09-22 DIAGNOSIS — Z3480 Encounter for supervision of other normal pregnancy, unspecified trimester: Secondary | ICD-10-CM

## 2017-09-22 DIAGNOSIS — Z3A32 32 weeks gestation of pregnancy: Secondary | ICD-10-CM | POA: Diagnosis not present

## 2017-09-22 DIAGNOSIS — Z23 Encounter for immunization: Secondary | ICD-10-CM | POA: Diagnosis not present

## 2017-09-22 DIAGNOSIS — N888 Other specified noninflammatory disorders of cervix uteri: Secondary | ICD-10-CM

## 2017-09-22 DIAGNOSIS — O4693 Antepartum hemorrhage, unspecified, third trimester: Secondary | ICD-10-CM | POA: Insufficient documentation

## 2017-09-22 DIAGNOSIS — O26893 Other specified pregnancy related conditions, third trimester: Secondary | ICD-10-CM

## 2017-09-22 LAB — WET PREP, GENITAL
Clue Cells Wet Prep HPF POC: NONE SEEN
SPERM: NONE SEEN
Trich, Wet Prep: NONE SEEN
Yeast Wet Prep HPF POC: NONE SEEN

## 2017-09-22 LAB — URINALYSIS, ROUTINE W REFLEX MICROSCOPIC
BILIRUBIN URINE: NEGATIVE
GLUCOSE, UA: NEGATIVE mg/dL
Ketones, ur: NEGATIVE mg/dL
NITRITE: NEGATIVE
Protein, ur: NEGATIVE mg/dL
SPECIFIC GRAVITY, URINE: 1.013 (ref 1.005–1.030)
pH: 7 (ref 5.0–8.0)

## 2017-09-22 NOTE — Progress Notes (Addendum)
   PRENATAL VISIT NOTE  Subjective:  Alyssa Oneill is a 37 y.o. G2P0010 at 1763w6d being seen today for ongoing prenatal care.  She is currently monitored for the following issues for this low-risk pregnancy and has Supervision of other normal pregnancy, antepartum and AMA (advanced maternal age) multigravida 35+ on their problem list.  Patient reports multiple episodes of spotting that begins with first void in morning and "continue a little bit" throughout the day. Patient endorses seeing continuous spotting on her pad last week and a new episode of spotting yesterday. Spotting is accompanied by bilateral low abdominal cramping. Patient states she did not call clinic or seek care in [facility] because symptoms resolved on their own.  Contractions: Irritability. Vag. Bleeding: none today  Movement: Present. Denies leaking of fluid.   The following portions of the patient's history were reviewed and updated as appropriate: allergies, current medications, past family history, past medical history, past social history, past surgical history and problem list. Problem list updated.  Objective:   Vitals:   09/22/17 0833  BP: 115/78  Pulse: 77  Weight: 121 lb 8 oz (55.1 kg)    Fetal Status: Fetal Heart Rate (bpm): 145   Movement: Present   Fundal height 32cm  General:  Alert, oriented and cooperative. Patient is in no acute distress.  Skin: Skin is warm and dry. No rash noted.   Cardiovascular: Normal heart rate noted  Respiratory: Normal respiratory effort, no problems with respiration noted  Abdomen: Soft, gravid, appropriate for gestational age.  Pain/Pressure: Present     Pelvic: Cervical exam deferred        Extremities: Normal range of motion.  Edema: Mild pitting, slight indentation  Mental Status: Normal mood and affect. Normal behavior. Normal judgment and thought content.   Assessment and Plan:  Pregnancy: G2P0010 at 8763w6d  1. Multigravida of advanced maternal age in third  trimester --Patient sent to [facility] for evaluation of cramping and spotting. Escorted by clinic staff and FOB --Report called to Haywood LassoLynette, [facility] Charge RN --Discussed with patient that any future episodes of spotting or cramping would, at a minimum, warrant call to clinic for discussion of next steps  Preterm labor symptoms and general obstetric precautions including but not limited to vaginal bleeding, contractions, leaking of fluid and fetal movement were reviewed in detail with the patient. Please refer to After Visit Summary for other counseling recommendations.  Return in about 2 weeks (around 10/06/2017).  Future Appointments  Date Time Provider Department Center  10/05/2017  8:55 AM Tamera StandsWallace, Laurel S, DO [facility]  10/19/2017  8:15 AM Katrinka BlazingSmith, [place], [facility]  10/26/2017  8:15 AM Marvetta GibbonsBurleson, Brand Maleserri L, NP [facility]  11/02/2017  8:15 AM Katrinka BlazingSmith, [place], CNM [facility]  11/09/2017  8:15 AM Tamera StandsWallace, Laurel S, DO [facility]    Calvert CantorSamantha C Weinhold, [place]  09/22/17  8:48 AM

## 2017-09-22 NOTE — MAU Provider Note (Signed)
Chief Complaint:  Abdominal Pain and Vaginal Bleeding   First Provider Initiated Contact with Patient 09/22/17 0936      HPI: Alyssa Oneill is a 37 y.o. G2P0010 at 5860w6d who presents to maternity admissions reporting episode of pink spotting last week and another episode yesterday, none today.  She had some associated irregular mild abdominal cramping yesterday but this resolved. She presented to the office for her routine visit today and was sent to MAU to evaluate bleeding.  There are no other s/sx. She has not tried any treatments.  She has not had recent intercourse but reports she may be overdoing things at work. She reports good fetal movement, denies LOF, vaginal itching/burning, urinary symptoms, h/a, dizziness, n/v, or fever/chills.    HPI  Past Medical History: Past Medical History:  Diagnosis Date  . Allergy   . Eczema   . Heart murmur   . Lactose intolerance   . Medical history non-contributory   . Toxic shock syndrome (HCC)     Past obstetric history: OB History  Gravida Para Term Preterm AB Living  2 0 0 0 1 0  SAB TAB Ectopic Multiple Live Births  1 0 0 0 0    # Outcome Date GA Lbr Len/2nd Weight Sex Delivery Anes PTL Lv  2 Current           1 SAB             Past Surgical History: Past Surgical History:  Procedure Laterality Date  . NO PAST SURGERIES      Family History: Family History  Problem Relation Age of Onset  . Hyperlipidemia Mother   . Hypertension Father   . Stroke Father     Social History: Social History   Tobacco Use  . Smoking status: Former Games developermoker  . Smokeless tobacco: Never Used  Substance Use Topics  . Alcohol use: Not Currently    Frequency: Never  . Drug use: No    Allergies:  Allergies  Allergen Reactions  . Advil [Ibuprofen] Anaphylaxis  . Avocado     Itchy throat  . Other Hives, Itching and Rash    Walnuts and peacans--facial edema, hives, diarrhea  . Vicks Babyrub [Liniments] Hives, Itching and Rash     Meds:  Medications Prior to Admission  Medication Sig Dispense Refill Last Dose  . hydrocortisone cream 1 % Apply 1 application topically 2 (two) times daily as needed for itching (eczema).   Taking  . mupirocin ointment (BACTROBAN) 2 % Place 1 application into the nose as needed.   Taking  . Prenatal Multivit-Min-Fe-FA (PRENATAL VITAMINS PO) Take 1 tablet by mouth daily.    Taking  . triamcinolone cream (KENALOG) 0.1 % Apply 1 application topically as needed.   Taking    ROS:  Review of Systems  Constitutional: Negative for chills, fatigue and fever.  Eyes: Negative for visual disturbance.  Respiratory: Negative for shortness of breath.   Cardiovascular: Negative for chest pain.  Gastrointestinal: Negative for abdominal pain, nausea and vomiting.  Genitourinary: Positive for vaginal bleeding. Negative for difficulty urinating, dysuria, flank pain, pelvic pain, vaginal discharge and vaginal pain.  Neurological: Negative for dizziness and headaches.  Psychiatric/Behavioral: Negative.      I have reviewed patient's Past Medical Hx, Surgical Hx, Family Hx, Social Hx, medications and allergies.   Physical Exam   Patient Vitals for the past 24 hrs:  BP Temp Temp src Pulse Resp SpO2 Height Weight  09/22/17 0916 108/67 98.3 F (36.8  C) Oral 82 16 99 % - -  09/22/17 0855 - - - - - - 5\' 3"  (1.6 m) 55.3 kg   Constitutional: Well-developed, well-nourished female in no acute distress.  Cardiovascular: normal rate Respiratory: normal effort GI: Abd soft, non-tender, gravid appropriate for gestational age.  MS: Extremities nontender, no edema, normal ROM Neurologic: Alert and oriented x 4.  GU: Neg CVAT.  PELVIC EXAM: Cervix pink, visually closed, area of erythema near os, friable to cotton swab, no blood noted until cotton swab touched cervix, then scant red bleeding from cervix, vaginal walls and external genitalia normal  Dilation: Closed Effacement (%): Thick Cervical  Position: Posterior Presentation: Vertex Exam by:: Leftwich-Kirby, CNM Scant red bleeding on glove following exam  FHT:  Baseline 135 , moderate variability, accelerations present, no decelerations Contractions: 1 in 30 minutes, mild to palpation   Labs: Results for orders placed or performed during the hospital encounter of 09/22/17 (from the past 24 hour(s))  Urinalysis, Routine w reflex microscopic     Status: Abnormal   Collection Time: 09/22/17  9:12 AM  Result Value Ref Range   Color, Urine YELLOW YELLOW   APPearance HAZY (A) CLEAR   Specific Gravity, Urine 1.013 1.005 - 1.030   pH 7.0 5.0 - 8.0   Glucose, UA NEGATIVE NEGATIVE mg/dL   Hgb urine dipstick MODERATE (A) NEGATIVE   Bilirubin Urine NEGATIVE NEGATIVE   Ketones, ur NEGATIVE NEGATIVE mg/dL   Protein, ur NEGATIVE NEGATIVE mg/dL   Nitrite NEGATIVE NEGATIVE   Leukocytes, UA SMALL (A) NEGATIVE   RBC / HPF 0-5 0 - 5 RBC/hpf   WBC, UA 6-10 0 - 5 WBC/hpf   Bacteria, UA RARE (A) NONE SEEN   Squamous Epithelial / LPF 6-10 0 - 5   Mucus PRESENT   Wet prep, genital     Status: Abnormal   Collection Time: 09/22/17  9:49 AM  Result Value Ref Range   Yeast Wet Prep HPF POC NONE SEEN NONE SEEN   Trich, Wet Prep NONE SEEN NONE SEEN   Clue Cells Wet Prep HPF POC NONE SEEN NONE SEEN   WBC, Wet Prep HPF POC FEW (A) NONE SEEN   Sperm NONE SEEN    O/Positive/-- (05/24 1209)  Imaging:  No results found.  MAU Course/MDM: I have ordered labs and reviewed results.  NST reviewed and reactive Prior US shows anterior placenta above os No bleeding evident on SSE prior to cotton swab then bleeding noted from friable cervix Cervix closed, no evidence of preterm labor Consult Dr Macon Large with assessment and findings D/C home with bleeding precautions Keep scheduled appts in office Return to MAU as needed for emergencies Pt discharge with strict bleeding precautions.  Assessment: 1. Friable cervix   2. Supervision of other normal  pregnancy, antepartum   3. Spotting affecting pregnancy in third trimester     Plan: Discharge home with bleeding precautions Labor precautions and fetal kick counts Follow-up Information    Center for [facility] Follow up.   Specialty:  Obstetrics and Gynecology Why:  As scheduled, return to MAU as needed for emergencies Contact information: [address] [place] [address] [phone]         Allergies as of 09/22/2017      Reactions   Advil [ibuprofen] Anaphylaxis   Avocado    Itchy throat   Other Hives, Itching, Rash   Walnuts and peacans--facial edema, hives, diarrhea   Vicks Babyrub [liniments] Hives, Itching, Rash  Medication List    TAKE these medications   hydrocortisone cream 1 % Apply 1 application topically 2 (two) times daily as needed for itching (eczema).   mupirocin ointment 2 % Commonly known as:  BACTROBAN Place 1 application into the nose as needed.   PRENATAL VITAMINS PO Take 1 tablet by mouth daily.   triamcinolone cream 0.1 % Commonly known as:  KENALOG Apply 1 application topically as needed.       Sharen Counter Certified Nurse-Midwife 09/22/2017 10:46 AM

## 2017-09-22 NOTE — Patient Instructions (Signed)

## 2017-09-22 NOTE — MAU Note (Signed)
Pt sent up from clinic for further evaluation of vaginal bleeding. Pt reports during her routine visit today an episode of bleeding and cramping yesterday and one day last week.

## 2017-09-22 NOTE — Addendum Note (Signed)
Addended by: Henrietta DineNEAL, PAMELA S on: 09/22/2017 10:27 AM   Modules accepted: Orders

## 2017-09-23 LAB — GC/CHLAMYDIA PROBE AMP (~~LOC~~) NOT AT ARMC
Chlamydia: NEGATIVE
Neisseria Gonorrhea: NEGATIVE

## 2017-10-05 ENCOUNTER — Ambulatory Visit (INDEPENDENT_AMBULATORY_CARE_PROVIDER_SITE_OTHER): Payer: Medicaid Other | Admitting: Family Medicine

## 2017-10-05 VITALS — BP 122/69 | HR 80 | Wt 122.7 lb

## 2017-10-05 DIAGNOSIS — O09523 Supervision of elderly multigravida, third trimester: Secondary | ICD-10-CM

## 2017-10-05 DIAGNOSIS — Z3483 Encounter for supervision of other normal pregnancy, third trimester: Secondary | ICD-10-CM

## 2017-10-05 DIAGNOSIS — O09529 Supervision of elderly multigravida, unspecified trimester: Secondary | ICD-10-CM

## 2017-10-05 DIAGNOSIS — Z3480 Encounter for supervision of other normal pregnancy, unspecified trimester: Secondary | ICD-10-CM

## 2017-10-05 NOTE — Patient Instructions (Addendum)
[email or web address].[email or web address].org - Check out for birth control methods   AREA PEDIATRIC/FAMILY PRACTICE PHYSICIANS  [facility] [address] [place], [place]  [address] Phone - [phone]   Fax - [phone]  [facility] [place] [address] [place], [place] [address] Phone - [phone]   Fax - [phone]  [facility] [address] [place], [place]  [address] Phone - [phone]   Fax - [phone]  [facility] [address] [place], [place]  [address] Phone - [phone]   Fax - [phone]  [facility] [place] [address] [place], [place]  [address] Phone - [phone]   Fax - [phone]  [facility] [address] [place], [place]  [address] Phone - [phone]   Fax - [phone]  [facility] [place] [address] [place], [place]  [address] Phone - [phone]   Fax - [phone]  [facility] AT [facility] [address] [place] [place], [place]  [address] Phone - [phone]   Fax - [phone]  [facility] AT [facility] [address] [place], [place]  [address] Phone - [phone]   Fax - [phone] [facility] [place] [address] [place], [place]  [address] Phone - [phone]   Fax - [phone]  [facility] [address] [place], [place]  [address] Phone - [phone]   Fax - [phone]  [facility] [address] [place], [place]  [address] Phone - [phone]   Fax - [phone]  [facility] [place] [address] [place], [place]  [address] Phone - [phone]   Fax - [phone]  [facility] [address], [address] [place], [place]  [address] Phone - [phone]  [facility] [address] [place], [place]  [address] Phone - [phone]   Fax -  [phone]  [facility] [address] [place], [place]  [address] Phone - [phone]   Fax - [phone]  [facility] [address] [place], [place]  [address] Phone - [phone]   Fax - [phone]  **[facility] [address] [place], [place]  [address] Phone - [phone]   Fax - [phone]   [facility] [address] [place], [place]  [address] Phone - [phone]   Fax - [phone]  [facility] [address] [place], [place]  [address] Phone - [phone]   Fax - [phone]  DA[facility]ID RUBIN [address] [place], [place]  [address] Phone - [phone]   Fax - [phone]  [facility] [address] [place], [place]  [address] Phone - [phone]   Fax - [phone]  [facility] [address] [place], [place]  [address] Phone - [phone]   Fax - [phone] Gerarda Fraction [address] [place], [place]  [address] Phone - [phone]   Fax - [phone]  [facility] [address] [place], [place]  [address] Phone - [phone]   Fax - [phone]  [facility] - [place] [address] [place], [place]  [address] Phone - [phone]   Fax - [phone]  [facility] [place] Wyvonne Lenz MD [address] [place] [address] Phone [phone]  Fax [phone]

## 2017-10-05 NOTE — Progress Notes (Signed)
   PRENATAL VISIT NOTE Subjective:  Alyssa Oneill is a 37 y.o. G2P0010 at [redacted]w[redacted]d being seen today for ongoing prenatal care.  She is currently monitored for the following issues for this low-risk pregnancy and has Supervision of other normal pregnancy, antepartum and AMA (advanced maternal age) multigravida 35+ on their problem list.  - pediatrician - not  Yet decided - birth control - counseled but doesn't know options, wants more children  - occasional contractions - FT, has pregnancy belt which helps but doesn't use often  - some spotting after intercourse, noted in MAU to have friable cervix    Patient reports trouble sleeping, irregular contractions.  Contractions: Irregular. Vag. Bleeding: None.  Movement: Present. Denies leaking of fluid.   The following portions of the patient's history were reviewed and updated as appropriate: allergies, current medications, past family history, past medical history, past social history, past surgical history and problem list. Problem list updated.  Objective:   Vitals:   10/05/17 0920  BP: 122/69  Pulse: 80  Weight: 122 lb 11.2 oz (55.7 kg)   Fetal Status: Fetal Heart Rate (bpm): 136 Fundal Height: 34 cm Movement: Present     General:  Alert, oriented and cooperative. Patient is in no acute distress.  Skin: Skin is warm and dry. No rash noted.   Cardiovascular: Normal heart rate noted  Respiratory: Normal respiratory effort, no problems with respiration noted  Abdomen: Soft, gravid, appropriate for gestational age.  Pain/Pressure: Absent     Pelvic: Cervical exam performed Dilation: Fingertip Effacement (%): Thick Station: Ballotable  Extremities: Normal range of motion.  Edema: None  Mental Status: Normal mood and affect. Normal behavior. Normal judgment and thought content.   Assessment and Plan:  Pregnancy: G2P0010 at [redacted]w[redacted]d  1. Supervision of other normal pregnancy, antepartum -- prenatal record reviewed and UTD -- cervical exam  unchanged from prior, likely Braxton-Hicks contractions -- counseled on birth control options, given list to choose PCP for baby  -- weight gain appropriate   2. Antepartum multigravida of advanced maternal age -- continue routine care   Preterm labor symptoms and general obstetric precautions including but not limited to vaginal bleeding, contractions, leaking of fluid and fetal movement were reviewed in detail with the patient. Please refer to After Visit Summary for other counseling recommendations.  Return in about 2 weeks (around 10/19/2017) for already scheduled .  Future Appointments  Date Time Provider Department Center  10/19/2017  8:15 AM Dorathy Kinsman, [place] [facility]  10/26/2017  8:15 AM Marvetta Gibbons Brand Males, NP [facility]  11/02/2017  8:15 AM Katrinka Blazing [place], CNM [facility]  11/09/2017  8:15 AM Tamera Stands, DO [facility]    Tamera Stands, DO

## 2017-10-18 NOTE — Patient Instructions (Signed)
Braxton Hicks Contractions °Contractions of the uterus can occur throughout pregnancy, but they are not always a sign that you are in labor. You may have practice contractions called Braxton Hicks contractions. These false labor contractions are sometimes confused with true labor. °What are Braxton Hicks contractions? °Braxton Hicks contractions are tightening movements that occur in the muscles of the uterus before labor. Unlike true labor contractions, these contractions do not result in opening (dilation) and thinning of the cervix. Toward the end of pregnancy (32-34 weeks), Braxton Hicks contractions can happen more often and may become stronger. These contractions are sometimes difficult to tell apart from true labor because they can be very uncomfortable. You should not feel embarrassed if you go to the hospital with false labor. °Sometimes, the only way to tell if you are in true labor is for your health care provider to look for changes in the cervix. The health care provider will do a physical exam and may monitor your contractions. If you are not in true labor, the exam should show that your cervix is not dilating and your water has not broken. °If there are other health problems associated with your pregnancy, it is completely safe for you to be sent home with false labor. You may continue to have Braxton Hicks contractions until you go into true labor. °How to tell the difference between true labor and false labor °True labor °· Contractions last 30-70 seconds. °· Contractions become very regular. °· Discomfort is usually felt in the top of the uterus, and it spreads to the lower abdomen and low back. °· Contractions do not go away with walking. °· Contractions usually become more intense and increase in frequency. °· The cervix dilates and gets thinner. °False labor °· Contractions are usually shorter and not as strong as true labor contractions. °· Contractions are usually irregular. °· Contractions  are often felt in the front of the lower abdomen and in the groin. °· Contractions may go away when you walk around or change positions while lying down. °· Contractions get weaker and are shorter-lasting as time goes on. °· The cervix usually does not dilate or become thin. °Follow these instructions at home: °· Take over-the-counter and prescription medicines only as told by your health care provider. °· Keep up with your usual exercises and follow other instructions from your health care provider. °· Eat and drink lightly if you think you are going into labor. °· If Braxton Hicks contractions are making you uncomfortable: °? Change your position from lying down or resting to walking, or change from walking to resting. °? Sit and rest in a tub of warm water. °? Drink enough fluid to keep your urine pale yellow. Dehydration may cause these contractions. °? Do slow and deep breathing several times an hour. °· Keep all follow-up prenatal visits as told by your health care provider. This is important. °Contact a health care provider if: °· You have a fever. °· You have continuous pain in your abdomen. °Get help right away if: °· Your contractions become stronger, more regular, and closer together. °· You have fluid leaking or gushing from your vagina. °· You pass blood-tinged mucus (bloody show). °· You have bleeding from your vagina. °· You have low back pain that you never had before. °· You feel your baby’s head pushing down and causing pelvic pressure. °· Your baby is not moving inside you as much as it used to. °Summary °· Contractions that occur before labor are called Braxton   Hicks contractions, false labor, or practice contractions. °· Braxton Hicks contractions are usually shorter, weaker, farther apart, and less regular than true labor contractions. True labor contractions usually become progressively stronger and regular and they become more frequent. °· Manage discomfort from Braxton Hicks contractions by  changing position, resting in a warm bath, drinking plenty of water, or practicing deep breathing. °This information is not intended to replace advice given to you by your health care provider. Make sure you discuss any questions you have with your health care provider. °Document Released: 05/13/2016 Document Revised: 05/13/2016 Document Reviewed: 05/13/2016 °Elsevier Interactive Patient Education © 2018 Elsevier Inc. ° °

## 2017-10-18 NOTE — Progress Notes (Signed)
   PRENATAL VISIT NOTE  Subjective:  Alyssa Oneill is a 37 y.o. G2P0010 at [redacted]w[redacted]d being seen today for ongoing prenatal care.  She is currently monitored for the following issues for this high-risk pregnancy and has Supervision of other normal pregnancy, antepartum and AMA (advanced maternal age) multigravida 35+ on their problem list.  Patient reports occasional contractions.  Contractions: Irregular. Vag. Bleeding: None.  Movement: Present. Denies leaking of fluid.   The following portions of the patient's history were reviewed and updated as appropriate: allergies, current medications, past family history, past medical history, past social history, past surgical history and problem list. Problem list updated.  Objective:   Vitals:   10/19/17 0834  BP: 126/83  Pulse: 68  Weight: 125 lb 6.4 oz (56.9 kg)    Fetal Status: Fetal Heart Rate (bpm): 133 Fundal Height: 37 cm Movement: Present  Presentation: Vertex by BS [place].   General:  Alert, oriented and cooperative. Patient is in no acute distress.  Skin: Skin is warm and dry. No rash noted.   Cardiovascular: Normal heart rate noted  Respiratory: Normal respiratory effort, no problems with respiration noted  Abdomen: Soft, gravid, appropriate for gestational age.  Pain/Pressure: Present     Pelvic: Cervical exam performed Dilation: Closed Effacement (%): Thick Station: -3  Extremities: Normal range of motion.  Edema: None  Mental Status: Normal mood and affect. Normal behavior. Normal judgment and thought content.   Assessment and Plan:  Pregnancy: G2P0010 at [redacted]w[redacted]d  1. Supervision of other normal pregnancy, antepartum  - GBS - GC/Chlamydia   2. Multigravida of advanced maternal age in third trimester   Term labor symptoms and general obstetric precautions including but not limited to vaginal bleeding, contractions, leaking of fluid and fetal movement were reviewed in detail with the patient. Please refer to After Visit Summary  for other counseling recommendations.  Return in about 1 week (around 10/26/2017) for ROB.  Future Appointments  Date Time Provider Department Center  10/26/2017  8:15 AM Currie Paris, NP [facility]  11/02/2017  8:15 AM Katrinka Blazing [place], CNM [facility]  11/09/2017  8:15 AM Tamera Stands, DO [facility] WOC    [place], [place]

## 2017-10-19 ENCOUNTER — Ambulatory Visit (INDEPENDENT_AMBULATORY_CARE_PROVIDER_SITE_OTHER): Payer: Medicaid Other | Admitting: Advanced Practice Midwife

## 2017-10-19 VITALS — BP 126/83 | HR 68 | Wt 125.4 lb

## 2017-10-19 DIAGNOSIS — Z3480 Encounter for supervision of other normal pregnancy, unspecified trimester: Secondary | ICD-10-CM | POA: Diagnosis not present

## 2017-10-19 DIAGNOSIS — O09523 Supervision of elderly multigravida, third trimester: Secondary | ICD-10-CM

## 2017-10-22 LAB — CULTURE, BETA STREP (GROUP B ONLY): Strep Gp B Culture: NEGATIVE

## 2017-10-26 ENCOUNTER — Ambulatory Visit (INDEPENDENT_AMBULATORY_CARE_PROVIDER_SITE_OTHER): Payer: Medicaid Other | Admitting: Nurse Practitioner

## 2017-10-26 VITALS — BP 105/76 | HR 67 | Wt 125.4 lb

## 2017-10-26 DIAGNOSIS — Z3480 Encounter for supervision of other normal pregnancy, unspecified trimester: Secondary | ICD-10-CM

## 2017-10-26 DIAGNOSIS — Z3483 Encounter for supervision of other normal pregnancy, third trimester: Secondary | ICD-10-CM

## 2017-10-26 NOTE — Progress Notes (Signed)
    Subjective:  Alyssa Oneill is a 37 y.o. G2P0010 at [redacted]w[redacted]d being seen today for ongoing prenatal care.  She is currently monitored for the following issues for this low-risk pregnancy and has Supervision of other normal pregnancy, antepartum and AMA (advanced maternal age) multigravida 35+ on their problem list.  Patient reports no complaints.  Contractions: Regular. Vag. Bleeding: Scant.  Movement: Present. Denies leaking of fluid.   The following portions of the patient's history were reviewed and updated as appropriate: allergies, current medications, past family history, past medical history, past social history, past surgical history and problem list. Problem list updated.  Objective:   Vitals:   10/26/17 0843  BP: 105/76  Pulse: 67  Weight: 125 lb 6.4 oz (56.9 kg)    Fetal Status: Fetal Heart Rate (bpm): 139 Fundal Height: 36 cm Movement: Present     General:  Alert, oriented and cooperative. Patient is in no acute distress.  Skin: Skin is warm and dry. No rash noted.   Cardiovascular: Normal heart rate noted  Respiratory: Normal respiratory effort, no problems with respiration noted  Abdomen: Soft, gravid, appropriate for gestational age. Pain/Pressure: Present     Pelvic:  Cervical exam deferred        Extremities: Normal range of motion.  Edema: None  Mental Status: Normal mood and affect. Normal behavior. Normal judgment and thought content.   Urinalysis:      Assessment and Plan:  Pregnancy: G2P0010 at [redacted]w[redacted]d  1. Supervision of other normal pregnancy, antepartum Return in one week  Term labor symptoms and general obstetric precautions including but not limited to vaginal bleeding, contractions, leaking of fluid and fetal movement were reviewed in detail with the patient. Please refer to After Visit Summary for other counseling recommendations.  Return in about 1 week (around 11/02/2017).  Nolene Bernheim, RN, MSN, NP-BC Nurse Practitioner, [facility] for [employer], [facility] 10/26/2017 8:56 AM

## 2017-11-02 ENCOUNTER — Ambulatory Visit (INDEPENDENT_AMBULATORY_CARE_PROVIDER_SITE_OTHER): Payer: Medicaid Other | Admitting: Advanced Practice Midwife

## 2017-11-02 VITALS — BP 117/83 | HR 76 | Wt 126.3 lb

## 2017-11-02 DIAGNOSIS — Z3480 Encounter for supervision of other normal pregnancy, unspecified trimester: Secondary | ICD-10-CM

## 2017-11-02 DIAGNOSIS — O133 Gestational [pregnancy-induced] hypertension without significant proteinuria, third trimester: Secondary | ICD-10-CM

## 2017-11-02 DIAGNOSIS — O09523 Supervision of elderly multigravida, third trimester: Secondary | ICD-10-CM

## 2017-11-02 LAB — POCT URINALYSIS DIP (DEVICE)
Bilirubin Urine: NEGATIVE
Glucose, UA: NEGATIVE mg/dL
HGB URINE DIPSTICK: NEGATIVE
Ketones, ur: NEGATIVE mg/dL
LEUKOCYTES UA: NEGATIVE
Nitrite: NEGATIVE
Protein, ur: NEGATIVE mg/dL
SPECIFIC GRAVITY, URINE: 1.02 (ref 1.005–1.030)
Urobilinogen, UA: 0.2 mg/dL (ref 0.0–1.0)
pH: 7 (ref 5.0–8.0)

## 2017-11-02 NOTE — Progress Notes (Signed)
C/o spotting yesterday. Denies headaches.

## 2017-11-02 NOTE — Progress Notes (Signed)
   PRENATAL VISIT NOTE  Subjective:  Alyssa Oneill is a 37 y.o. G2P0010 at [redacted]w[redacted]d being seen today for ongoing prenatal care.  She is currently monitored for the following issues for this high-risk pregnancy and has Supervision of other normal pregnancy, antepartum and AMA (advanced maternal age) multigravida 35+ on their problem list.  Patient reports occasional contractions.  Contractions: Irregular. Vag. Bleeding: Scant.  Movement: Present. Denies leaking of fluid.   The following portions of the patient's history were reviewed and updated as appropriate: allergies, current medications, past family history, past medical history, past social history, past surgical history and problem list. Problem list updated.  Objective:   Vitals:   11/02/17 1104 11/02/17 1107  BP: 131/90 117/83  Pulse: 70 76  Weight: 126 lb 4.8 oz (57.3 kg)     Fetal Status: Fetal Heart Rate (bpm): 130 Fundal Height: 38 cm Movement: Present  Presentation: Vertex  General:  Alert, oriented and cooperative. Patient is in no acute distress.  Skin: Skin is warm and dry. No rash noted.   Cardiovascular: Normal heart rate noted  Respiratory: Normal respiratory effort, no problems with respiration noted  Abdomen: Soft, gravid, appropriate for gestational age.  Pain/Pressure: Present     Pelvic: Cervical exam deferred        Extremities: Normal range of motion.  Edema: None  Mental Status: Normal mood and affect. Normal behavior. Normal judgment and thought content.   Assessment and Plan:  Pregnancy: G2P0010 at [redacted]w[redacted]d  1. Supervision of other normal pregnancy, antepartum   2. Multigravida of advanced maternal age in third trimester   Term labor symptoms and general obstetric precautions including but not limited to vaginal bleeding, contractions, leaking of fluid and fetal movement were reviewed in detail with the patient. Please refer to After Visit Summary for other counseling recommendations.  No follow-ups  on file.  Future Appointments  Date Time Provider Department Center  11/09/2017  8:15 AM Tamera Stands, DO [facility]    [place], [place]

## 2017-11-02 NOTE — Patient Instructions (Addendum)
Preeclampsia and Eclampsia Preeclampsia is a serious condition that develops only during pregnancy. It is also called toxemia of pregnancy. This condition causes high blood pressure along with other symptoms, such as swelling and headaches. These symptoms may develop as the condition gets worse. Preeclampsia may occur at 20 weeks of pregnancy or later. Diagnosing and treating preeclampsia early is very important. If not treated early, it can cause serious problems for you and your baby. One problem it can lead to is eclampsia, which is a condition that causes muscle jerking or shaking (convulsions or seizures) in the mother. Delivering your baby is the best treatment for preeclampsia or eclampsia. Preeclampsia and eclampsia symptoms usually go away after your baby is born. What are the causes? The cause of preeclampsia is not known. What increases the risk? The following risk factors make you more likely to develop preeclampsia:  Being pregnant for the first time.  Having had preeclampsia during a past pregnancy.  Having a family history of preeclampsia.  Having high blood pressure.  Being pregnant with twins or triplets.  Being 46 or older.  Being African-American.  Having kidney disease or diabetes.  Having medical conditions such as lupus or blood diseases.  Being very overweight (obese).  What are the signs or symptoms? The earliest signs of preeclampsia are:  High blood pressure.  Increased protein in your urine. Your health care provider will check for this at every visit before you give birth (prenatal visit).  Other symptoms that may develop as the condition gets worse include:  Severe headaches.  Sudden weight gain.  Swelling of the hands, face, legs, and feet.  Nausea and vomiting.  Vision problems, such as blurred or double vision.  Numbness in the face, arms, legs, and feet.  Urinating less than usual.  Dizziness.  Slurred speech.  Abdominal pain,  especially upper abdominal pain.  Convulsions or seizures.  Symptoms generally go away after giving birth. How is this diagnosed? There are no screening tests for preeclampsia. Your health care provider will ask you about symptoms and check for signs of preeclampsia during your prenatal visits. You may also have tests that include:  Urine tests.  Blood tests.  Checking your blood pressure.  Monitoring your baby's heart rate.  Ultrasound.  How is this treated? You and your health care provider will determine the treatment approach that is best for you. Treatment may include:  Having more frequent prenatal exams to check for signs of preeclampsia, if you have an increased risk for preeclampsia.  Bed rest.  Reducing how much salt (sodium) you eat.  Medicine to lower your blood pressure.  Staying in the hospital, if your condition is severe. There, treatment will focus on controlling your blood pressure and the amount of fluids in your body (fluid retention).  You may need to take medicine (magnesium sulfate) to prevent seizures. This medicine may be given as an injection or through an IV tube.  Delivering your baby early, if your condition gets worse. You may have your labor started with medicine (induced), or you may have a cesarean delivery.  Follow these instructions at home: Eating and drinking   Drink enough fluid to keep your urine clear or pale yellow.  Eat a healthy diet that is low in sodium. Do not add salt to your food. Check nutrition labels to see how much sodium a food or beverage contains.  Avoid caffeine. Lifestyle  Do not use any products that contain nicotine or tobacco, such as cigarettes  and e-cigarettes. If you need help quitting, ask your health care provider.  Do not use alcohol or drugs.  Avoid stress as much as possible. Rest and get plenty of sleep. General instructions  Take over-the-counter and prescription medicines only as told by your  health care provider.  When lying down, lie on your side. This keeps pressure off of your baby.  When sitting or lying down, raise (elevate) your feet. Try putting some pillows underneath your lower legs.  Exercise regularly. Ask your health care provider what kinds of exercise are best for you.  Keep all follow-up and prenatal visits as told by your health care provider. This is important. How is this prevented? To prevent preeclampsia or eclampsia from developing during another pregnancy:  Get proper medical care during pregnancy. Your health care provider may be able to prevent preeclampsia or diagnose and treat it early.  Your health care provider may have you take a low-dose aspirin or a calcium supplement during your next pregnancy.  You may have tests of your blood pressure and kidney function after giving birth.  Maintain a healthy weight. Ask your health care provider for help managing weight gain during pregnancy.  Work with your health care provider to manage any long-term (chronic) health conditions you have, such as diabetes or kidney problems.  Contact a health care provider if:  You gain more weight than expected.  You have headaches.  You have nausea or vomiting.  You have abdominal pain.  You feel dizzy or light-headed. Get help right away if:  You develop sudden or severe swelling anywhere in your body. This usually happens in the legs.  You gain 5 lbs (2.3 kg) or more during one week.  You have severe: ? Abdominal pain. ? Headaches. ? Dizziness. ? Vision problems. ? Confusion. ? Nausea or vomiting.  You have a seizure.  You have trouble moving any part of your body.  You develop numbness in any part of your body.  You have trouble speaking.  You have any abnormal bleeding.  You pass out. This information is not intended to replace advice given to you by your health care provider. Make sure you discuss any questions you have with your health  care provider. Document Released: 12/26/1999 Document Revised: 08/26/2015 Document Reviewed: 08/04/2015 Elsevier Interactive Patient Education  2018 [employer].   [employer] of the uterus can occur throughout pregnancy, but they are not always a sign that you are in labor. You may have practice contractions called Braxton Hicks contractions. These false labor contractions are sometimes confused with true labor. What are Deberah Pelton contractions? Braxton Hicks contractions are tightening movements that occur in the muscles of the uterus before labor. Unlike true labor contractions, these contractions do not result in opening (dilation) and thinning of the cervix. Toward the end of pregnancy (32-34 weeks), Braxton Hicks contractions can happen more often and may become stronger. These contractions are sometimes difficult to tell apart from true labor because they can be very uncomfortable. You should not feel embarrassed if you go to the hospital with false labor. Sometimes, the only way to tell if you are in true labor is for your health care provider to look for changes in the cervix. The health care provider will do a physical exam and may monitor your contractions. If you are not in true labor, the exam should show that your cervix is not dilating and your water has not broken. If there are other health  problems associated with your pregnancy, it is completely safe for you to be sent home with false labor. You may continue to have Braxton Hicks contractions until you go into true labor. How to tell the difference between true labor and false labor True labor  Contractions last 30-70 seconds.  Contractions become very regular.  Discomfort is usually felt in the top of the uterus, and it spreads to the lower abdomen and low back.  Contractions do not go away with walking.  Contractions usually become more intense and increase in frequency.  The cervix dilates  and gets thinner. False labor  Contractions are usually shorter and not as strong as true labor contractions.  Contractions are usually irregular.  Contractions are often felt in the front of the lower abdomen and in the groin.  Contractions may go away when you walk around or change positions while lying down.  Contractions get weaker and are shorter-lasting as time goes on.  The cervix usually does not dilate or become thin. Follow these instructions at home:  Take over-the-counter and prescription medicines only as told by your health care provider.  Keep up with your usual exercises and follow other instructions from your health care provider.  Eat and drink lightly if you think you are going into labor.  If Braxton Hicks contractions are making you uncomfortable: ? Change your position from lying down or resting to walking, or change from walking to resting. ? Sit and rest in a tub of warm water. ? Drink enough fluid to keep your urine pale yellow. Dehydration may cause these contractions. ? Do slow and deep breathing several times an hour.  Keep all follow-up prenatal visits as told by your health care provider. This is important. Contact a health care provider if:  You have a fever.  You have continuous pain in your abdomen. Get help right away if:  Your contractions become stronger, more regular, and closer together.  You have fluid leaking or gushing from your vagina.  You pass blood-tinged mucus (bloody show).  You have bleeding from your vagina.  You have low back pain that you never had before.  You feel your baby's head pushing down and causing pelvic pressure.  Your baby is not moving inside you as much as it used to. Summary  Contractions that occur before labor are called Braxton Hicks contractions, false labor, or practice contractions.  Braxton Hicks contractions are usually shorter, weaker, farther apart, and less regular than true labor  contractions. True labor contractions usually become progressively stronger and regular and they become more frequent.  Manage discomfort from [facility] contractions by changing position, resting in a warm bath, drinking plenty of water, or practicing deep breathing. This information is not intended to replace advice given to you by your health care provider. Make sure you discuss any questions you have with your health care provider. Document Released: 05/13/2016 Document Revised: 05/13/2016 Document Reviewed: 05/13/2016 Elsevier Interactive Patient Education  2018 [employer].

## 2017-11-03 LAB — CBC
Hematocrit: 37.9 % (ref 34.0–46.6)
Hemoglobin: 13.2 g/dL (ref 11.1–15.9)
MCH: 31.4 pg (ref 26.6–33.0)
MCHC: 34.8 g/dL (ref 31.5–35.7)
MCV: 90 fL (ref 79–97)
PLATELETS: 259 10*3/uL (ref 150–450)
RBC: 4.2 x10E6/uL (ref 3.77–5.28)
RDW: 13.6 % (ref 12.3–15.4)
WBC: 9.8 10*3/uL (ref 3.4–10.8)

## 2017-11-03 LAB — COMPREHENSIVE METABOLIC PANEL
A/G RATIO: 1.4 (ref 1.2–2.2)
ALT: 14 IU/L (ref 0–32)
AST: 19 IU/L (ref 0–40)
Albumin: 3.5 g/dL (ref 3.5–5.5)
Alkaline Phosphatase: 135 IU/L — ABNORMAL HIGH (ref 39–117)
BUN/Creatinine Ratio: 13 (ref 9–23)
BUN: 7 mg/dL (ref 6–20)
Bilirubin Total: 0.2 mg/dL (ref 0.0–1.2)
CALCIUM: 8.7 mg/dL (ref 8.7–10.2)
CO2: 19 mmol/L — AB (ref 20–29)
Chloride: 104 mmol/L (ref 96–106)
Creatinine, Ser: 0.56 mg/dL — ABNORMAL LOW (ref 0.57–1.00)
GFR calc Af Amer: 138 mL/min/{1.73_m2} (ref >59)
GFR, EST NON AFRICAN AMERICAN: 119 mL/min/{1.73_m2} (ref >59)
Globulin, Total: 2.5 g/dL (ref 1.5–4.5)
Glucose: 79 mg/dL (ref 65–99)
POTASSIUM: 3.9 mmol/L (ref 3.5–5.2)
Sodium: 138 mmol/L (ref 134–144)
Total Protein: 6 g/dL (ref 6.0–8.5)

## 2017-11-03 LAB — PROTEIN / CREATININE RATIO, URINE
CREATININE, UR: 135.3 mg/dL
PROTEIN UR: 20.9 mg/dL
PROTEIN/CREAT RATIO: 154 mg/g{creat} (ref 0–200)

## 2017-11-04 ENCOUNTER — Other Ambulatory Visit: Payer: Self-pay

## 2017-11-04 ENCOUNTER — Inpatient Hospital Stay (HOSPITAL_COMMUNITY): Payer: Medicaid Other | Admitting: Anesthesiology

## 2017-11-04 ENCOUNTER — Encounter (HOSPITAL_COMMUNITY): Payer: Self-pay | Admitting: *Deleted

## 2017-11-04 ENCOUNTER — Inpatient Hospital Stay (HOSPITAL_COMMUNITY)
Admission: AD | Admit: 2017-11-04 | Discharge: 2017-11-06 | DRG: 806 | Disposition: A | Discharge status: Home / Self Care | Payer: Medicaid Other | Attending: Obstetrics and Gynecology | Admitting: Obstetrics and Gynecology

## 2017-11-04 DIAGNOSIS — O9081 Anemia of the puerperium: Secondary | ICD-10-CM | POA: Diagnosis not present

## 2017-11-04 DIAGNOSIS — Z3A39 39 weeks gestation of pregnancy: Secondary | ICD-10-CM

## 2017-11-04 DIAGNOSIS — Z3480 Encounter for supervision of other normal pregnancy, unspecified trimester: Secondary | ICD-10-CM

## 2017-11-04 DIAGNOSIS — Z87891 Personal history of nicotine dependence: Secondary | ICD-10-CM

## 2017-11-04 DIAGNOSIS — Z3483 Encounter for supervision of other normal pregnancy, third trimester: Secondary | ICD-10-CM | POA: Diagnosis present

## 2017-11-04 LAB — CBC
HCT: 38.4 % (ref 36.0–46.0)
HEMATOCRIT: 30.8 % — AB (ref 36.0–46.0)
HEMOGLOBIN: 13.2 g/dL (ref 12.0–15.0)
HEMOGLOBIN: 10.6 g/dL — AB (ref 12.0–15.0)
MCH: 31 pg (ref 26.0–34.0)
MCH: 31.3 pg (ref 26.0–34.0)
MCHC: 34.4 g/dL (ref 30.0–36.0)
MCHC: 34.4 g/dL (ref 30.0–36.0)
MCV: 90.1 fL (ref 80.0–100.0)
MCV: 90.9 fL (ref 80.0–100.0)
NRBC: 0 % (ref 0.0–0.2)
NRBC: 0 % (ref 0.0–0.2)
Platelets: 285 10*3/uL (ref 150–400)
Platelets: 248 10*3/uL (ref 150–400)
RBC: 4.26 MIL/uL (ref 3.87–5.11)
RBC: 3.39 MIL/uL — AB (ref 3.87–5.11)
RDW: 13.7 % (ref 11.5–15.5)
RDW: 13.7 % (ref 11.5–15.5)
WBC: 14.7 10*3/uL — AB (ref 4.0–10.5)
WBC: 33 10*3/uL — AB (ref 4.0–10.5)

## 2017-11-04 LAB — TYPE AND SCREEN
ABO/RH(D): O POS
ANTIBODY SCREEN: NEGATIVE

## 2017-11-04 LAB — RPR: RPR Ser Ql: NONREACTIVE

## 2017-11-04 MED ORDER — DIPHENHYDRAMINE HCL 25 MG PO CAPS: 25.0000 mg | ORAL_CAPSULE | Freq: Four times a day (QID) | ORAL | Status: DC | PRN

## 2017-11-04 MED ORDER — SIMETHICONE 80 MG PO CHEW: 80.0000 mg | CHEWABLE_TABLET | ORAL | Status: DC | PRN

## 2017-11-04 MED ORDER — FENTANYL 2.5 MCG/ML BUPIVACAINE 1/10 % EPIDURAL INFUSION (WH - ANES)
14.0000 mL/h | INTRAMUSCULAR | Status: DC | PRN
  Administered 2017-11-04: 13 mL/h via EPIDURAL
  Filled 2017-11-04: qty 100

## 2017-11-04 MED ORDER — SENNOSIDES-DOCUSATE SODIUM 8.6-50 MG PO TABS
2.0000 | ORAL_TABLET | ORAL | Status: DC
  Administered 2017-11-05: 2 via ORAL
  Filled 2017-11-04: qty 2

## 2017-11-04 MED ORDER — FLEET ENEMA 7-19 GM/118ML RE ENEM: 1.0000 | ENEMA | RECTAL | Status: DC | PRN

## 2017-11-04 MED ORDER — ONDANSETRON HCL 4 MG/2ML IJ SOLN: 4.0000 mg | INTRAMUSCULAR | Status: DC | PRN

## 2017-11-04 MED ORDER — LACTATED RINGERS IV SOLN: 500.0000 mL | Freq: Once | INTRAVENOUS | Status: DC

## 2017-11-04 MED ORDER — MISOPROSTOL 200 MCG PO TABS
ORAL_TABLET | ORAL | Status: AC
  Filled 2017-11-04: qty 4

## 2017-11-04 MED ORDER — DIPHENHYDRAMINE HCL 50 MG/ML IJ SOLN: 12.5000 mg | INTRAMUSCULAR | Status: DC | PRN

## 2017-11-04 MED ORDER — DIBUCAINE 1 % RE OINT: 1.0000 "application " | TOPICAL_OINTMENT | RECTAL | Status: DC | PRN

## 2017-11-04 MED ORDER — COCONUT OIL OIL
1.0000 "application " | TOPICAL_OIL | Status: DC | PRN
  Filled 2017-11-04: qty 120

## 2017-11-04 MED ORDER — FENTANYL CITRATE (PF) 100 MCG/2ML IJ SOLN
50.0000 ug | INTRAMUSCULAR | Status: DC | PRN
  Administered 2017-11-04 (×2): 100 ug via INTRAVENOUS
  Filled 2017-11-04 (×2): qty 2

## 2017-11-04 MED ORDER — SOD CITRATE-CITRIC ACID 500-334 MG/5ML PO SOLN: 30.0000 mL | ORAL | Status: DC | PRN

## 2017-11-04 MED ORDER — PRENATAL MULTIVITAMIN CH
1.0000 | ORAL_TABLET | Freq: Every day | ORAL | Status: DC
  Administered 2017-11-05: 1 via ORAL
  Filled 2017-11-04: qty 1

## 2017-11-04 MED ORDER — PHENYLEPHRINE 40 MCG/ML (10ML) SYRINGE FOR IV PUSH (FOR BLOOD PRESSURE SUPPORT)
80.0000 ug | PREFILLED_SYRINGE | INTRAVENOUS | Status: DC | PRN
  Filled 2017-11-04: qty 10
  Filled 2017-11-04: qty 5

## 2017-11-04 MED ORDER — PHENYLEPHRINE 40 MCG/ML (10ML) SYRINGE FOR IV PUSH (FOR BLOOD PRESSURE SUPPORT)
80.0000 ug | PREFILLED_SYRINGE | INTRAVENOUS | Status: DC | PRN
  Filled 2017-11-04: qty 5

## 2017-11-04 MED ORDER — EPHEDRINE 5 MG/ML INJ
10.0000 mg | INTRAVENOUS | Status: DC | PRN
  Filled 2017-11-04: qty 2

## 2017-11-04 MED ORDER — SODIUM CHLORIDE 0.9% FLUSH: 3.0000 mL | INTRAVENOUS | Status: DC | PRN

## 2017-11-04 MED ORDER — MEASLES, MUMPS & RUBELLA VAC ~~LOC~~ INJ: 0.5000 mL | INJECTION | Freq: Once | SUBCUTANEOUS | Status: DC

## 2017-11-04 MED ORDER — LACTATED RINGERS IV SOLN
500.0000 mL | INTRAVENOUS | Status: DC | PRN
  Administered 2017-11-04: 500 mL via INTRAVENOUS
  Administered 2017-11-04: 1000 mL via INTRAVENOUS

## 2017-11-04 MED ORDER — ONDANSETRON HCL 4 MG PO TABS: 4.0000 mg | ORAL_TABLET | ORAL | Status: DC | PRN

## 2017-11-04 MED ORDER — OXYTOCIN BOLUS FROM INFUSION
500.0000 mL | Freq: Once | INTRAVENOUS | Status: AC
  Administered 2017-11-04: 500 mL via INTRAVENOUS

## 2017-11-04 MED ORDER — LACTATED RINGERS IV SOLN
INTRAVENOUS | Status: DC
  Administered 2017-11-04: 05:00:00 via INTRAVENOUS

## 2017-11-04 MED ORDER — OXYTOCIN 40 UNITS IN LACTATED RINGERS INFUSION - SIMPLE MED
2.5000 [IU]/h | INTRAVENOUS | Status: DC
  Filled 2017-11-04 (×2): qty 1000

## 2017-11-04 MED ORDER — SODIUM CHLORIDE 0.9 % IV SOLN: 250.0000 mL | INTRAVENOUS | Status: DC | PRN

## 2017-11-04 MED ORDER — OXYCODONE-ACETAMINOPHEN 5-325 MG PO TABS: 2.0000 | ORAL_TABLET | ORAL | Status: DC | PRN

## 2017-11-04 MED ORDER — HYDROXYZINE HCL 50 MG PO TABS
50.0000 mg | ORAL_TABLET | Freq: Four times a day (QID) | ORAL | Status: DC | PRN
  Filled 2017-11-04: qty 1

## 2017-11-04 MED ORDER — ONDANSETRON HCL 4 MG/2ML IJ SOLN: 4.0000 mg | Freq: Four times a day (QID) | INTRAMUSCULAR | Status: DC | PRN

## 2017-11-04 MED ORDER — SODIUM CHLORIDE 0.9% FLUSH
3.0000 mL | Freq: Two times a day (BID) | INTRAVENOUS | Status: DC
  Administered 2017-11-05: 3 mL via INTRAVENOUS

## 2017-11-04 MED ORDER — ZOLPIDEM TARTRATE 5 MG PO TABS: 5.0000 mg | ORAL_TABLET | Freq: Every evening | ORAL | Status: DC | PRN

## 2017-11-04 MED ORDER — FERROUS SULFATE 325 (65 FE) MG PO TABS: 325.0000 mg | ORAL_TABLET | Freq: Every day | ORAL | 3 refills | Status: DC

## 2017-11-04 MED ORDER — ACETAMINOPHEN 325 MG PO TABS: 650.0000 mg | ORAL_TABLET | ORAL | Status: DC | PRN

## 2017-11-04 MED ORDER — OXYCODONE-ACETAMINOPHEN 5-325 MG PO TABS: 1.0000 | ORAL_TABLET | ORAL | Status: DC | PRN

## 2017-11-04 MED ORDER — SODIUM CHLORIDE 0.9 % IV SOLN
INTRAVENOUS | Status: DC | PRN
  Administered 2017-11-04: 500 mL via INTRAVENOUS

## 2017-11-04 MED ORDER — LIDOCAINE HCL (PF) 1 % IJ SOLN
30.0000 mL | INTRAMUSCULAR | Status: DC | PRN
  Filled 2017-11-04: qty 30

## 2017-11-04 MED ORDER — ACETAMINOPHEN 500 MG PO TABS
500.0000 mg | ORAL_TABLET | ORAL | Status: DC
  Administered 2017-11-04 – 2017-11-05 (×7): 500 mg via ORAL
  Filled 2017-11-04 (×9): qty 1

## 2017-11-04 MED ORDER — SODIUM CHLORIDE 0.9 % IV SOLN
2.0000 g | Freq: Once | INTRAVENOUS | Status: AC
  Administered 2017-11-04: 2 g via INTRAVENOUS
  Filled 2017-11-04: qty 2

## 2017-11-04 MED ORDER — LIDOCAINE HCL (PF) 1 % IJ SOLN
INTRAMUSCULAR | Status: DC | PRN
  Administered 2017-11-04 (×2): 4 mL via EPIDURAL

## 2017-11-04 MED ORDER — MISOPROSTOL 200 MCG PO TABS
800.0000 ug | ORAL_TABLET | Freq: Once | ORAL | Status: AC
  Administered 2017-11-04: 800 ug via RECTAL

## 2017-11-04 MED ORDER — BENZOCAINE-MENTHOL 20-0.5 % EX AERO: 1.0000 "application " | INHALATION_SPRAY | CUTANEOUS | Status: DC | PRN

## 2017-11-04 MED ORDER — TETANUS-DIPHTH-ACELL PERTUSSIS 5-2.5-18.5 LF-MCG/0.5 IM SUSP: 0.5000 mL | Freq: Once | INTRAMUSCULAR | Status: DC

## 2017-11-04 MED ORDER — BUPIVACAINE HCL (PF) 0.25 % IJ SOLN
INTRAMUSCULAR | Status: DC | PRN
  Administered 2017-11-04: 8 mL via EPIDURAL

## 2017-11-04 MED ORDER — WITCH HAZEL-GLYCERIN EX PADS: 1.0000 "application " | MEDICATED_PAD | CUTANEOUS | Status: DC | PRN

## 2017-11-04 NOTE — MAU Note (Signed)
Pt reports contractions and bloody show. No recent cervical exam. Reports good fetal movement

## 2017-11-04 NOTE — Anesthesia Preprocedure Evaluation (Signed)
Anesthesia Evaluation  Patient identified by MRN, date of birth, ID band Patient awake    Reviewed: Allergy & Precautions, Patient's Chart, lab work & pertinent test results  Airway Mallampati: II  TM Distance: >3 FB Neck ROM: Full    Dental no notable dental hx. (+) Teeth Intact   Pulmonary former smoker,    Pulmonary exam normal breath sounds clear to auscultation       Cardiovascular negative cardio ROS Normal cardiovascular exam Rhythm:Regular Rate:Normal     Neuro/Psych negative neurological ROS  negative psych ROS   GI/Hepatic negative GI ROS, Neg liver ROS,   Endo/Other  negative endocrine ROS  Renal/GU negative Renal ROS  negative genitourinary   Musculoskeletal negative musculoskeletal ROS (+)   Abdominal   Peds negative pediatric ROS (+)  Hematology negative hematology ROS (+)   Anesthesia Other Findings   Reproductive/Obstetrics (+) Pregnancy                             Anesthesia Physical Anesthesia Plan  ASA: II  Anesthesia Plan: Epidural   Post-op Pain Management:    Induction:   PONV Risk Score and Plan: 2 and Treatment may vary due to age or medical condition  Airway Management Planned: Natural Airway  Additional Equipment:   Intra-op Plan:   Post-operative Plan:   Informed Consent: I have reviewed the patients History and Physical, chart, labs and discussed the procedure including the risks, benefits and alternatives for the proposed anesthesia with the patient or authorized representative who has indicated his/her understanding and acceptance.     Plan Discussed with: CRNA  Anesthesia Plan Comments:         Anesthesia Quick Evaluation  

## 2017-11-04 NOTE — Lactation Note (Signed)
This note was copied from a baby's chart. Lactation Consultation Note  Patient Name: Alyssa Oneill [id number] Date: 11/04/2017 Reason for consult: Initial assessment;Other (Comment);Term;Primapara;1st time breastfeeding(AMA)  8 hours old FT female who is being exclusively BF by her mother, she's a P1. Mom came as breast and formula though and she may supplement at some point, she voiced she's not ready to start pumping at the hospital when [facility] offered to set up a DEBP due to baby's weight being at 6 lbs, LC explained to parents that most likely baby's weight will drop below 6 lbs tomorrow, and parents were OK with supplementing with formula if they had to.  Mom doesn't have a pump at home, but FOB is planning on getting one, LC recommended a couple of good brands (Spectra or Medela) when he voiced he won't be getting his pump through insurance. When [facility] assisted with hand expression, mom unable to get colostrum out of her right breast, a small droplet came out of the left breast when LC and RN Broke who was shadowing lactation assisted with hand expression.  Offered assistance with latch, mom agreed to wake baby up to feed, LC took baby to mom's breast STS in football position and she was able to latch after a few tries, and sustained the latch throughout the 21 minutes feedings, she only came off twice, but LC helped mom to reposition baby, and also showed her how to break a latch.  Parents were very engaged in [facility] consultation and had lots of questions. Discussed cluster feeding, lactogenesis II and supplementation. Mom asked for formula brands, LC unable to recommend them due to ethical reasons, but explained which ones we use at the hospital. [facility] also did some hands on with parents and showed them how to pick up baby, how to burp baby and how to swaddle her. Parents were very pleased.  Feeding plan:  1. Encouraged mom to feed baby STS every 3 hours or sooner if feeding cues are present 2. Hand  expression and spoon feeding were also encouraged.   Parents aware that baby may need to get supplemented at some point and that mother's milk will always be our first choice for supplementation. Mom may consider pumping at the hospital and will let her RN know if that is the case. BF brochure, BF resources and feeding diary were reviewed. Parents reported all questions and concerns were answered, they're both aware of LC services and will call PRN.  Maternal Data Formula Feeding for Exclusion: Yes Reason for exclusion: Mother's choice to formula and breast feed on admission Has patient been taught Hand Expression?: Yes Does the patient have breastfeeding experience prior to this delivery?: No  Feeding Feeding Type: Breast Fed  LATCH Score Latch: Repeated attempts needed to sustain latch, nipple held in mouth throughout feeding, stimulation needed to elicit sucking reflex.  Audible Swallowing: A few with stimulation  Type of Nipple: Everted at rest and after stimulation  Comfort (Breast/Nipple): Soft / non-tender  Hold (Positioning): Assistance needed to correctly position infant at breast and maintain latch.  LATCH Score: 7  Interventions Interventions: Breast feeding basics reviewed;Assisted with latch;Skin to skin;Breast massage;Hand express;Breast compression;Adjust position;Support pillows  Lactation Tools Discussed/Used WIC Program: No   Consult Status Consult Status: Follow-up Date: 11/05/17 Follow-up type: In-patient    Maja Mccaffery Venetia Constable 11/04/2017, 9:00 PM

## 2017-11-04 NOTE — Progress Notes (Signed)
MD notified regarding vital signs and patient is very pale.  Pt said she is very tired, encouraged her to eat and drink. CBC ordered; will continue to monitor.

## 2017-11-04 NOTE — Anesthesia Procedure Notes (Signed)
Epidural Patient location during procedure: OB Start time: 11/04/2017 10:33 AM End time: 11/04/2017 10:35 AM  Staffing Anesthesiologist: Kaylyn Layer, MD Performed: anesthesiologist   Preanesthetic Checklist Completed: patient identified, pre-op evaluation, timeout performed, IV checked, risks and benefits discussed and monitors and equipment checked  Epidural Patient position: sitting Prep: site prepped and draped and DuraPrep Patient monitoring: continuous pulse ox, blood pressure, heart rate and cardiac monitor Approach: midline Location: L3-L4 Injection technique: LOR air  Needle:  Needle type: Tuohy  Needle gauge: 17 G Needle length: 9 cm Needle insertion depth: 5 cm Catheter type: closed end flexible Catheter size: 19 Gauge Catheter at skin depth: 10 cm Test dose: negative and Other (1% lidocaine)  Assessment Events: blood not aspirated, injection not painful, no injection resistance, negative IV test and no paresthesia  Additional Notes Patient identified. Risks, benefits, and alternatives discussed with patient including but not limited to bleeding, infection, nerve damage, paralysis, failed block, incomplete pain control, headache, blood pressure changes, nausea, vomiting, reactions to medication, itching, and postpartum back pain. Confirmed with bedside nurse the patient's most recent platelet count. Confirmed with patient that they are not currently taking any anticoagulation, have any bleeding history, or any family history of bleeding disorders. Patient expressed understanding and wished to proceed. All questions were answered. Sterile technique was used throughout the entire procedure. Please see nursing notes for vital signs. Test dose was given through epidural catheter and negative prior to continuing to dose epidural or start infusion. Warning signs of high block given to the patient including shortness of breath, tingling/numbness in hands, complete motor  block, or any concerning symptoms with instructions to call for help. Patient was given instructions on fall risk and not to get out of bed. All questions and concerns addressed with instructions to call with any issues or inadequate analgesia.  Reason for block:procedure for pain

## 2017-11-04 NOTE — Anesthesia Pain Management Evaluation Note (Signed)
  [facility] Pain Management Visit Note  Patient: Alyssa Oneill, 37 y.o., female  "Hello I am a member of the anesthesia team at [facility]. We have an anesthesia team available at all times to provide care throughout the hospital, including epidural management and anesthesia for C-section. I don't know your plan for the delivery whether it a natural birth, water birth, IV sedation, nitrous supplementation, doula or epidural, but we want to meet your pain goals."   1.Was your pain managed to your expectations on prior hospitalizations?   Yes   2.What is your expectation for pain management during this hospitalization?     IV pain meds  3.How can we help you reach that goal? IV pain Rx  Record the patient's initial score and the patient's pain goal.   Pain: 3  Pain Goal: 5 [facility] wants you to be able to say your pain was always managed very well.  Alyssa Oneill 11/04/2017

## 2017-11-04 NOTE — H&P (Signed)
Alyssa Oneill is a 37 y.o. female G2P0010 with IUP at [redacted]w[redacted]d presenting for contractions for several hours. Unsure of how far apart they are. some vaginal bleeding for several hours..  Membranes are intact, with active fetal movement.   [facility] at [facility]  Prenatal History/Complications:  Early SAB  Past Medical History: Past Medical History:  Diagnosis Date  . Allergy   . Eczema   . Heart murmur   . Lactose intolerance   . Medical history non-contributory   . 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 NO PAST SURGERIES      Obstetrical History: OB History    Gravida  2   Para  0   Term  0   Preterm  0   AB  1   Living  0     SAB  1   TAB  0   Ectopic  0   Multiple  0   Live Births  0            Social History: Social History   Socioeconomic History  . Marital status: Single    Spouse name: Not on file  . Number of children: Not on file  . Years of education: Not on file  . Highest education level: Not on file  Occupational History  . Not on file  Social Needs  . Financial resource strain: Not on file  . Food insecurity:    Worry: Not on file    Inability: Not on file  . Transportation needs:    Medical: Not on file    Non-medical: Not on file  Tobacco Use  . Smoking status: Former Games developer  . Smokeless tobacco: Never Used  Substance and Sexual Activity  . Alcohol use: Not Currently    Frequency: Never  . Drug use: No  . Sexual activity: Yes    Birth control/protection: None  Lifestyle  . Physical activity:    Days per week: Not on file    Minutes per session: Not on file  . Stress: Not on file  Relationships  . Social connections:    Talks on phone: Not on file    Gets together: Not on file    Attends religious service: Not on file    Active member of club or organization: Not on file    Attends meetings of clubs or organizations: Not on file    Relationship  status: Not on file  Other Topics Concern  . Not on file  Social History Narrative  . Not on file    Family History: Family History  Problem Relation Age of Onset  . Hyperlipidemia Mother   . Hypertension Father   . Stroke Father     Allergies: Allergies  Allergen Reactions  . Advil [Ibuprofen] Anaphylaxis  . Avocado     Itchy throat  . Other Hives, Itching and Rash    Walnuts and peacans--facial edema, hives, diarrhea  . Vicks Babyrub [Liniments] Hives, Itching and Rash    Medications Prior to Admission  Medication Sig Dispense Refill Last Dose  . Prenatal Multivit-Min-Fe-FA (PRENATAL VITAMINS PO) Take 1 tablet by mouth daily.    11/04/2017 at 0000  . hydrocortisone cream 1 % Apply 1 application topically 2 (two) times daily as needed for itching (eczema).   11/02/2017  . triamcinolone cream (KENALOG) 0.1 % Apply 1 application topically as needed.   11/02/2017  Review of Systems   Constitutional: Negative for fever and chills Eyes: Negative for visual disturbances Respiratory: Negative for shortness of breath, dyspnea Cardiovascular: Negative for chest pain or palpitations  Gastrointestinal: Negative for vomiting, diarrhea and constipation.  POSITIVE for abdominal pain (contractions) Genitourinary: Negative for dysuria and urgency Musculoskeletal: Negative for back pain, joint pain, myalgias  Neurological: Negative for dizziness and headaches      Blood pressure 124/69, pulse 87, temperature 98.7 F (37.1 C), temperature source Oral, resp. rate 16, height 5\' 3"  (1.6 m), weight 57.2 kg, last menstrual period 02/04/2017. General appearance: alert, cooperative and no distress Lungs: clear to auscultation bilaterally Heart: regular rate and rhythm Abdomen: soft, non-tender; bowel sounds normal Extremities: Homans sign is negative, no sign of DVT DTR's 2+ Presentation: cephalic Fetal monitoring  Baseline: 150 bpm, Variability: Good {> 6 bpm),  Accelerations: Reactive and Decelerations: Variable: mild to moderate w/each contraction, improving w/position changes/IVF/02 Uterine activity  q 6-9 minutes, mild  Pt was admitted from MAU w/report of cx being C/C/+1. Upon arrival to the unit, noted to be posterior,  Dilation: 1.5 Effacement (%): 100 Station: -1 Exam by:: Cathie Beams, CNM   Prenatal labs: ABO, Rh: --/--/O POS (10/25 0443) Antibody: NEG (10/25 0443) Rubella: 2.15 (05/24 1209) RPR: Non Reactive (08/14 0847)  HBsAg: Negative (05/24 1209)  HIV: Non Reactive (08/14 0847)    Prenatal Transfer Tool  Maternal Diabetes: No Genetic Screening: Normal Maternal Ultrasounds/Referrals: Normal Fetal Ultrasounds or other Referrals:  None Maternal Substance Abuse:  No Significant Maternal Medications:  None Significant Maternal Lab Results: Lab values include: Group B Strep negative    Nursing Staff Provider  Office Location  [facility] Dating  LMP  Language  English Anatomy US   Normal - female, anterior placenta  Flu Vaccine  09/22/17 Genetic Screen  NIPS: low risk  AFP:  negative  TDaP vaccine   08/24/17 Hgb A1C or  GTT Early N/A Third trimester 69-91-61  Rhogam   n/a   LAB RESULTS   Feeding Plan Breast Blood Type   O pos  Contraception Undecided Antibody  Neg  Circumcision Yes Rubella  Immune  Pediatrician  List given RPR   NR  Support Person Michael(FOB) HBsAg   Neg  Prenatal Classes List given HIV  NR  BTL Consent  n/a GBS  (For PCN allergy, check sensitivities) Neg  VBAC Consent  n/a Pap  negative (06/03/2017)    Hgb Electro   nl    CF Negative     SMA 2 copies    Waterbirth  [ ]  Class [ ]  Consent [ ]  CNM visit     Results for orders placed or performed during the hospital encounter of 11/04/17 (from the past 24 hour(s))  CBC   Collection Time: 11/04/17  4:43 AM  Result Value Ref Range   WBC 14.7 (H) 4.0 - 10.5 K/uL   RBC 4.26 3.87 - 5.11 MIL/uL   Hemoglobin 13.2 12.0 - 15.0 g/dL   HCT 16.1 09.6  - 04.5 %   MCV 90.1 80.0 - 100.0 fL   MCH 31.0 26.0 - 34.0 pg   MCHC 34.4 30.0 - 36.0 g/dL   RDW 40.9 81.1 - 91.4 %   Platelets 285 150 - 400 K/uL   nRBC 0.0 0.0 - 0.2 %  Type and screen [facility]   Collection Time: 11/04/17  4:43 AM  Result Value Ref Range   ABO/RH(D) O POS    Antibody Screen NEG  Sample Expiration      11/07/2017 Performed at [facility], [address], [place], [place] [address]     Assessment: Alyssa Oneill is a 37 y.o. G2P0010 with an IUP at [redacted]w[redacted]d presenting for contractions. FHR Cat 2.>improving.  WIll continue to monitor.  .   Plan: Will need IOL when FHR tracing allows #Pain:  Per request #FWB Cat 1    Jacklyn Shell 11/04/2017, 6:30 AM

## 2017-11-05 ENCOUNTER — Encounter: Payer: Self-pay | Admitting: Family Medicine

## 2017-11-05 ENCOUNTER — Encounter (HOSPITAL_COMMUNITY): Payer: Self-pay

## 2017-11-05 LAB — CBC
HEMATOCRIT: 24.4 % — AB (ref 36.0–46.0)
Hemoglobin: 8.6 g/dL — ABNORMAL LOW (ref 12.0–15.0)
MCH: 31.9 pg (ref 26.0–34.0)
MCHC: 35.2 g/dL (ref 30.0–36.0)
MCV: 90.4 fL (ref 80.0–100.0)
Platelets: 207 10*3/uL (ref 150–400)
RBC: 2.7 MIL/uL — AB (ref 3.87–5.11)
RDW: 14 % (ref 11.5–15.5)
WBC: 20.7 10*3/uL — ABNORMAL HIGH (ref 4.0–10.5)
nRBC: 0 % (ref 0.0–0.2)

## 2017-11-05 NOTE — Anesthesia Postprocedure Evaluation (Signed)
Anesthesia Post Note  Patient: Alyssa Oneill  Procedure(s) Performed: AN AD HOC LABOR EPIDURAL     Patient location during evaluation: Mother Baby Anesthesia Type: Epidural Level of consciousness: awake and alert Pain management: pain level controlled Vital Signs Assessment: post-procedure vital signs reviewed and stable Respiratory status: spontaneous breathing, nonlabored ventilation and respiratory function stable Cardiovascular status: stable Postop Assessment: no headache, no backache, epidural receding and patient able to bend at knees Anesthetic complications: no    Last Vitals:  Vitals:   11/05/17 0031 11/05/17 0432  BP: 101/71 104/68  Pulse: 97 (!) 101  Resp: 16 19  Temp: 36.8 C 36.7 C  SpO2:      Last Pain:  Vitals:   11/05/17 0601  TempSrc:   PainSc: 4    Pain Goal:                 Rica Records

## 2017-11-05 NOTE — Plan of Care (Signed)
Patient progressing 

## 2017-11-05 NOTE — Progress Notes (Addendum)
Post Partum Day 1 Subjective: no complaints, up ad lib, voiding and tolerating PO  No symptoms of anemia  Objective: Blood pressure 104/68, pulse (!) 101, temperature 98 F (36.7 C), temperature source Oral, resp. rate 19, height 5\' 3"  (1.6 m), weight 57.2 kg, last menstrual period 02/04/2017, SpO2 98 %, unknown if currently breastfeeding.  Physical Exam:  General: alert and cooperative, no pallor Lochia: appropriate Uterine Fundus: firm Incision: NA DVT Evaluation: No evidence of DVT seen on physical exam.  Recent Labs    11/04/17 1659 11/05/17 0652  HGB 10.6* 8.6*  HCT 30.8* 24.4*    Assessment/Plan: Plan for discharge tomorrow vs. Late PM discharge  PPH w/ manual sweep performed: Hgb downtrending 13.1-->10.6-->8.6 - No sx of anemia, will recheck H/H prior to discharge   LOS: 1 day   JACOB E PERRIN 11/05/2017, 10:50 AM   OB FELLOW POSTPARTUM PROGRESS NOTE ATTESTATION  I have seen and examined this patient and agree with above documentation in the resident's note.   Marcy Siren, D.O. OB Fellow  11/05/2017, 11:06 AM

## 2017-11-06 LAB — CBC WITH DIFFERENTIAL/PLATELET
Basophils Absolute: 0 10*3/uL (ref 0.0–0.1)
Basophils Relative: 0 %
Eosinophils Absolute: 0.6 10*3/uL (ref 0.0–0.5)
Eosinophils Relative: 3 %
HEMATOCRIT: 25.5 % — AB (ref 36.0–46.0)
HEMOGLOBIN: 8.8 g/dL — AB (ref 12.0–15.0)
Lymphocytes Relative: 14 %
Lymphs Abs: 2.8 10*3/uL (ref 0.7–4.0)
MCH: 31.4 pg (ref 26.0–34.0)
MCHC: 34.5 g/dL (ref 30.0–36.0)
MCV: 91.1 fL (ref 80.0–100.0)
MONOS PCT: 5 %
Monocytes Absolute: 1 10*3/uL (ref 0.1–1.0)
NEUTROS ABS: 15.3 10*3/uL (ref 1.7–7.7)
NEUTROS PCT: 78 %
Platelets: 252 10*3/uL (ref 150–400)
RBC: 2.8 MIL/uL — ABNORMAL LOW (ref 3.87–5.11)
RDW: 14.2 % (ref 11.5–15.5)
WBC: 19.7 10*3/uL — ABNORMAL HIGH (ref 4.0–10.5)
nRBC: 0 % (ref 0.0–0.2)

## 2017-11-06 NOTE — Discharge Summary (Addendum)
Obstetrics Discharge Summary OB/GYN Faculty Practice   Patient Name: Alyssa Oneill DOB: [birth year] MRN: [id number]  Date of admission: 11/04/2017 Delivering MD: Allayne Stack   Date of discharge: 11/06/2017  Admitting diagnosis: 39WKS CTX Intrauterine pregnancy: [redacted]w[redacted]d     Secondary diagnosis:   Active Problems:   Normal labor   Additional problems:  . None     Discharge diagnosis: Term Pregnancy Delivered                                            Postpartum procedures: None - manual sweep Complications: [facility]  Hospital course: Alyssa Oneill is a 37 y.o. [redacted]w[redacted]d who was admitted for labor. Her pregnancy and labor course were uncomplicated. Delivery was complicated by PPH requiring cytotec and manual uterine sweep to remove retained products, ancef given x1. Please see delivery note for additional details. Her postpartum course was complicated by anemia after her PPH, drop in her hemoglobin from 13.1->10.6-->8.6. Patient was asymptomatic and her bleeding continued to diminish, on day of discharge her hemoglobin had stabilized at 8.8.  By day of discharge, she was passing flatus, urinating, eating and drinking without difficulty. Her pain was well-controlled, and she was discharged home. She will follow-up in clinic in 4-6 weeks.   Physical exam  Vitals:   11/05/17 0432 11/05/17 1434 11/05/17 2247 11/06/17 0453  BP: 104/68 115/78 128/78 104/76  Pulse: (!) 101 97 (!) 101 87  Resp: 19  18 16   Temp: 98 F (36.7 C) 98.6 F (37 C) 98.2 F (36.8 C) 98.4 F (36.9 C)  TempSrc: Oral Oral Oral Oral  SpO2:  98%    Weight:      Height:       General: Well-appearing, NAD Lochia: appropriate Uterine Fundus: firm Incision: N/A DVT Evaluation: No evidence of DVT seen on physical exam. Labs: Lab Results  Component Value Date   WBC 19.7 (H) 11/06/2017   HGB 8.8 (L) 11/06/2017   HCT 25.5 (L) 11/06/2017   MCV 91.1 11/06/2017   PLT 252 11/06/2017   CMP Latest Ref Rng & Units  11/02/2017  Glucose 65 - 99 mg/dL 79  BUN 6 - 20 mg/dL 7  Creatinine 5.78 - 4.69 mg/dL 6.29(B)  Sodium 284 - 132 mmol/L 138  Potassium 3.5 - 5.2 mmol/L 3.9  Chloride 96 - 106 mmol/L 104  CO2 20 - 29 mmol/L 19(L)  Calcium 8.7 - 10.2 mg/dL 8.7  Total Protein 6.0 - 8.5 g/dL 6.0  Total Bilirubin 0.0 - 1.2 mg/dL 0.2  Alkaline Phos 39 - 117 IU/L 135(H)  AST 0 - 40 IU/L 19  ALT 0 - 32 IU/L 14    Discharge instructions: Per After Visit Summary and "Baby and Me Booklet"  After visit meds:  Allergies as of 11/06/2017      Reactions   Advil [ibuprofen] Anaphylaxis   Avocado    Itchy throat   Other Hives, Itching, Rash   Walnuts and peacans--facial edema, hives, diarrhea   Vicks Babyrub [liniments] Hives, Itching, Rash      Medication List    STOP taking these medications   hydrocortisone cream 1 %   triamcinolone cream 0.1 % Commonly known as:  KENALOG     TAKE these medications   ferrous sulfate 325 (65 FE) MG tablet Take 1 tablet (325 mg total) by mouth daily.   PRENATAL VITAMINS PO Take  1 tablet by mouth daily.       Postpartum contraception: Continue to undecided - slight preference towards COCPs, but will decide ouptatient Diet: Routine Diet Activity: Advance as tolerated. Pelvic rest for 6 weeks.   Outpatient follow up:4-6wk Follow-up Appt: Future Appointments  Date Time Provider Department Center  11/09/2017  8:15 AM Tamera Stands, DO [facility]   Follow-up Visit:No follow-ups on file.  Newborn Data: Live born female  Birth Weight: 6 lb (2722 g) APGAR: 9, 9  Newborn Delivery   Birth date/time:  11/04/2017 12:53:00 Delivery type:  Vaginal, Spontaneous     Baby Feeding: Bottle Disposition:home with mother  Terisa Starr, MD  OB FELLOW DISCHARGE ATTESTATION  I have seen and examined this patient and agree with above documentation in the resident's note.   Marcy Siren, D.O. OB Fellow  11/06/2017, 5:54 PM

## 2017-11-06 NOTE — Lactation Note (Signed)
This note was copied from a baby's chart. Lactation Consultation Note  Patient Name: Alyssa Oneill Date: 11/06/2017 Reason for consult: Follow-up assessment;1st time breastfeeding;Term;Mother's request;Difficult latch P1, 40 hours female infant. Mom's feeding choice is breast and formula. LC ask mom hand express and  Massage breast colostrum is present. Infant latched left breast using cross cradle hold infant latched wide mouth with swallows observed. LC ask mom break latch if she feels pain and re-latch baby with wide mouth, chin down, nose touching breast. Infant sustain latch and BF for 15 minutes. Mom was still BF as LC left room. Mom plans to BF first then supplement with formula after feedings if infant is still cuing.  Maternal Data    Feeding Feeding Type: Formula Nipple Type: Slow - flow  LATCH Score Latch: Grasps breast easily, tongue down, lips flanged, rhythmical sucking.  Audible Swallowing: Spontaneous and intermittent  Type of Nipple: Everted at rest and after stimulation  Comfort (Breast/Nipple): Soft / non-tender  Hold (Positioning): Assistance needed to correctly position infant at breast and maintain latch.  LATCH Score: 9  Interventions Interventions: Hand express;Breast massage;Support pillows;Adjust position;Breast compression  Lactation Tools Discussed/Used     Consult Status Consult Status: Follow-up Date: 11/07/17 Follow-up type: In-patient    Danelle Earthly 11/06/2017, 5:49 AM

## 2017-11-09 ENCOUNTER — Encounter: Payer: Medicaid Other | Admitting: Family Medicine

## 2017-11-17 DIAGNOSIS — L2089 Other atopic dermatitis: Secondary | ICD-10-CM | POA: Diagnosis not present

## 2017-11-17 DIAGNOSIS — L011 Impetiginization of other dermatoses: Secondary | ICD-10-CM | POA: Diagnosis not present

## 2017-11-17 DIAGNOSIS — L0101 Non-bullous impetigo: Secondary | ICD-10-CM | POA: Diagnosis not present

## 2017-12-14 ENCOUNTER — Ambulatory Visit (INDEPENDENT_AMBULATORY_CARE_PROVIDER_SITE_OTHER): Payer: Medicaid Other | Admitting: Nurse Practitioner

## 2017-12-14 DIAGNOSIS — Z8759 Personal history of other complications of pregnancy, childbirth and the puerperium: Secondary | ICD-10-CM | POA: Insufficient documentation

## 2017-12-14 DIAGNOSIS — Z1389 Encounter for screening for other disorder: Secondary | ICD-10-CM | POA: Diagnosis not present

## 2017-12-14 DIAGNOSIS — O165 Unspecified maternal hypertension, complicating the puerperium: Secondary | ICD-10-CM

## 2017-12-14 MED ORDER — NIFEDIPINE ER OSMOTIC RELEASE 30 MG PO TB24: 30.0000 mg | ORAL_TABLET | Freq: Every day | ORAL | 0 refills | Status: AC

## 2017-12-14 NOTE — Patient Instructions (Addendum)
[facility] Me classes  at [facility]

## 2017-12-14 NOTE — Progress Notes (Signed)
Subjective:     Alyssa Oneill is a 37 y.o. female who presents for a postpartum visit. She is 6 weeks postpartum following a spontaneous vaginal delivery. I have fully reviewed the prenatal and intrapartum course. The delivery was at [redacted] weeks gestational weeks. Outcome: spontaneous vaginal delivery. Anesthesia: epidural. Postpartum course has been good but wonders whether her vaginal stitches are still there and wants a vaginal check. Baby's course has been normal. Baby is feeding by bottle - Similac Advance. Bleeding staining only. Bowel function is normal. Bladder function is normal. Patient is not sexually active. Contraception method is condoms. Postpartum depression screening: negative.  The following portions of the patient's history were reviewed and updated as appropriate: allergies, current medications, past family history, past medical history, past social history, past surgical history and problem list.  Review of Systems Pertinent items noted in HPI and remainder of comprehensive ROS otherwise negative.   Objective:    BP (!) 143/100   Pulse 70   Wt 108 lb (49 kg)   BMI 19.13 kg/m   General:  alert, cooperative and no distress   Breasts:  deferred  Lungs: clear to auscultation bilaterally  Heart:  regular rate and rhythm, S1, S2 normal, no murmur, click, rub or gallop  Abdomen: soft, non-tender; bowel sounds normal; no masses,  no organomegaly   Vulva:  normal  Vagina: normal vagina for postpartum - no stitches seen, area still somewhat tender, no bleeding seen  Cervix:  deferred  Corpus: not examined  Adnexa:  not evaluated  Rectal Exam: Not performed.       Checked vagina visually -  Assessment:   Postpartum hypertension with no symptoms noted at postpartum exam. Pap smear not done at today's visit.   Plan:    1. Contraception: condoms 2. Started Procardia XL 30 mg daily - prescribed to her pharmacy. 3. Follow up in: 2 weeks or as needed.  Will plan follow up with  PCP after this appointment in 2 weeks. Advised to attend Mommy and Me postpartum group here at [facility] for more support. Advised eating protein at least 3 times a day at meals as she is likely not getting enough nutritional intake as reported in 24 hour diet recall. Advised having the baby use the breast as a pacifier as she is mostly bottle feeding but reports she would like to increase her milk supply.  Advised to make an appointment with the lactation consultant.  Baby is gaining well.

## 2017-12-15 DIAGNOSIS — O165 Unspecified maternal hypertension, complicating the puerperium: Secondary | ICD-10-CM | POA: Insufficient documentation

## 2017-12-28 ENCOUNTER — Ambulatory Visit (INDEPENDENT_AMBULATORY_CARE_PROVIDER_SITE_OTHER): Payer: Medicaid Other | Admitting: *Deleted

## 2017-12-28 VITALS — BP 143/98 | HR 74

## 2017-12-28 DIAGNOSIS — O165 Unspecified maternal hypertension, complicating the puerperium: Secondary | ICD-10-CM

## 2017-12-28 NOTE — Progress Notes (Signed)
Here for blood pressure check. Had elevated bp 144/101 at postpartum check. Was ordered procardia. Patient admits she has not yet started the procardia- states she Has been too busy with baby; but will pick up today. States she has only had a headache once since last visit. Bp today remains elevated.  Discussed with Dr.Davis and advised patient to pick up procardia today and start and come back in one week for bp check. Also discussed after that will need to see her PCP to manage her blood pressure. She states she goes to [facility].  Patient states she cannot come back in one week because will be out of town; states she can come 01/09/18. Advised her if she has a severe headache either while in town or out of town to go to ER. She voices understanding.  Devyn Sheerin,RN

## 2018-01-09 ENCOUNTER — Ambulatory Visit: Payer: Medicaid Other

## 2018-01-12 ENCOUNTER — Ambulatory Visit: Payer: Medicaid Other

## 2018-01-13 ENCOUNTER — Ambulatory Visit (INDEPENDENT_AMBULATORY_CARE_PROVIDER_SITE_OTHER): Payer: Medicaid Other | Admitting: General Practice

## 2018-01-13 VITALS — BP 124/92 | HR 74 | Ht 63.0 in | Wt 107.0 lb

## 2018-01-13 DIAGNOSIS — Z0130 Encounter for examination of blood pressure without abnormal findings: Secondary | ICD-10-CM

## 2018-01-13 NOTE — Progress Notes (Signed)
Patient presents to office today for BP check following last visit on 12/18 for BP check. Patient states she has been taking the procardia consistently for a week now. Patient reports occasional mild headaches and does not take anything for them- denies dizziness or blurry vision. Patient states she has been checking her BP at home and it has been 100s over 80s. Patient states she thinks her blood pressure was just high because she was stressed with the new baby, had in laws in town, & her baby doesn't sleep well at night.   Reviewed blood pressure with Dr Marice Potter who states patient can discontinue BP medication and should follow up with PCP.  Discussed discontinuing BP medication with patient and following up with PCP. Patient verbalized understanding and asked about hypertension diet & sugar intake. Reviewed dietary recommendations of plenty of fruits/vegetables, small portions of lean meats, decreasing sodium intake, shopping the "outer aisles" of the grocery store, avoiding foods from boxes or cans and emphasizing fresh cooked foods. Also discussed some form of physical activity at least 30 minutes a day. Patient verbalized understanding & had no other questions.  Chase Caller RN BSN 01/13/18

## 2018-01-16 NOTE — Progress Notes (Signed)
I have reviewed this chart and agree with the RN/CMA assessment and management.    Delmy Holdren C Latasha Puskas, MD, FACOG Attending Physician, Faculty Practice [facility]  

## 2018-01-24 ENCOUNTER — Ambulatory Visit (INDEPENDENT_AMBULATORY_CARE_PROVIDER_SITE_OTHER): Payer: Medicaid Other | Admitting: Family Medicine

## 2018-01-24 ENCOUNTER — Encounter: Payer: Self-pay | Admitting: Family Medicine

## 2018-01-24 VITALS — BP 116/82 | HR 74 | Temp 98.5°F | Wt 107.0 lb

## 2018-01-24 DIAGNOSIS — Z0130 Encounter for examination of blood pressure without abnormal findings: Secondary | ICD-10-CM

## 2018-01-24 NOTE — Patient Instructions (Signed)
Postpartum Hypertension Postpartum hypertension is high blood pressure that remains higher than normal after childbirth. You may not realize that you have postpartum hypertension if your blood pressure is not being checked regularly. In most cases, postpartum hypertension will go away on its own, usually within a week of delivery. However, for some women, medical treatment is required to prevent serious complications, such as seizures or stroke. What are the causes? This condition may be caused by one or more of the following:  Hypertension that existed before pregnancy (chronic hypertension).  Hypertension that comes on as a result of pregnancy (gestational hypertension).  Hypertensive disorders during pregnancy (preeclampsia) or seizures in women who have high blood pressure during pregnancy (eclampsia).  A condition in which the liver, platelets, and red blood cells are damaged during pregnancy (HELLP syndrome).  A condition in which the thyroid produces too much hormones (hyperthyroidism).  Other rare problems of the nerves (neurological disorders) or blood disorders. In some cases, the cause may not be known. What increases the risk? The following factors may make you more likely to develop this condition:  Chronic hypertension. In some cases, this may not have been diagnosed before pregnancy.  Obesity.  Type 2 diabetes.  Kidney disease.  History of preeclampsia or eclampsia.  Other medical conditions that change the level of hormones in the body (hormonal imbalance). What are the signs or symptoms? As with all types of hypertension, postpartum hypertension may not have any symptoms. Depending on how high your blood pressure is, you may experience:  Headaches. These may be mild, moderate, or severe. They may also be steady, constant, or sudden in onset (thunderclap headache).  Changes in your ability to see (visual changes).  Dizziness.  Shortness of breath.  Swelling  of your hands, feet, lower legs, or face. In some cases, you may have swelling in more than one of these locations.  Heart palpitations or a racing heartbeat.  Difficulty breathing while lying down.  Decrease in the amount of urine that you pass. Other rare signs and symptoms may include:  Sweating more than usual. This lasts longer than a few days after delivery.  Chest pain.  Sudden dizziness when you get up from sitting or lying down.  Seizures.  Nausea or vomiting.  Abdominal pain. How is this diagnosed? This condition may be diagnosed based on the results of a physical exam, blood pressure measurements, and blood and urine tests. You may also have other tests, such as a CT scan or an MRI, to check for other problems of postpartum hypertension. How is this treated? If blood pressure is high enough to require treatment, your options may include:  Medicines to reduce blood pressure (antihypertensives). Tell your health care provider if you are breastfeeding or if you plan to breastfeed. There are many antihypertensive medicines that are safe to take while breastfeeding.  Stopping medicines that may be causing hypertension.  Treating medical conditions that are causing hypertension.  Treating the complications of hypertension, such as seizures, stroke, or kidney problems. Your health care provider will also continue to monitor your blood pressure closely until it is within a safe range for you. Follow these instructions at home:  Take over-the-counter and prescription medicines only as told by your health care provider.  Return to your normal activities as told by your health care provider. Ask your health care provider what activities are safe for you.  Do not use any products that contain nicotine or tobacco, such as cigarettes and e-cigarettes. If   you need help quitting, ask your health care provider.  Keep all follow-up visits as told by your health care provider. This  is important. Contact a health care provider if:  Your symptoms get worse.  You have new symptoms, such as: ? A headache that does not get better. ? Dizziness. ? Visual changes. Get help right away if:  You suddenly develop swelling in your hands, ankles, or face.  You have sudden, rapid weight gain.  You develop difficulty breathing, chest pain, racing heartbeat, or heart palpitations.  You develop severe pain in your abdomen.  You have any symptoms of a stroke. "BE FAST" is an easy way to remember the main warning signs of a stroke: ? B - Balance. Signs are dizziness, sudden trouble walking, or loss of balance. ? E - Eyes. Signs are trouble seeing or a sudden change in vision. ? F - Face. Signs are sudden weakness or numbness of the face, or the face or eyelid drooping on one side. ? A - Arms. Signs are weakness or numbness in an arm. This happens suddenly and usually on one side of the body. ? S - Speech. Signs are sudden trouble speaking, slurred speech, or trouble understanding what people say. ? T - Time. Time to call emergency services. Write down what time symptoms started.  You have other signs of a stroke, such as: ? A sudden, severe headache with no known cause. ? Nausea or vomiting. ? Seizure. These symptoms may represent a serious problem that is an emergency. Do not wait to see if the symptoms will go away. Get medical help right away. Call your local emergency services (911 in the [place] Do not drive yourself to the hospital. Summary  Postpartum hypertension is high blood pressure that remains higher than normal after childbirth.  In most cases, postpartum hypertension will go away on its own, usually within a week of delivery.  For some women, medical treatment is required to prevent serious complications, such as seizures or stroke. This information is not intended to replace advice given to you by your health care provider. Make sure you discuss any questions  you have with your health care provider. Document Released: 08/31/2013 Document Revised: 10/18/2016 Document Reviewed: 10/18/2016 Elsevier Interactive Patient Education  2019 [employer].  How to Take Your Blood Pressure You can take your blood pressure at home with a machine. You may need to check your blood pressure at home:  To check if you have high blood pressure (hypertension).  To check your blood pressure over time.  To make sure your blood pressure medicine is working. Supplies needed: You will need a blood pressure machine, or monitor. You can buy one at a drugstore or online. When choosing one:  Choose one with an arm cuff.  Choose one that wraps around your upper arm. Only one finger should fit between your arm and the cuff.  Do not choose one that measures your blood pressure from your wrist or finger. Your doctor can suggest a monitor. How to prepare Avoid these things for 30 minutes before checking your blood pressure:  Drinking caffeine.  Drinking alcohol.  Eating.  Smoking.  Exercising. Five minutes before checking your blood pressure:  Pee.  Sit in a dining chair. Avoid sitting in a soft couch or armchair.  Be quiet. Do not talk. How to take your blood pressure Follow the instructions that came with your machine. If you have a digital blood pressure monitor, these may be the  instructions: 1. Sit up straight. 2. Place your feet on the floor. Do not cross your ankles or legs. 3. Rest your left arm at the level of your heart. You may rest it on a table, desk, or chair. 4. Pull up your shirt sleeve. 5. Wrap the blood pressure cuff around the upper part of your left arm. The cuff should be 1 inch (2.5 cm) above your elbow. It is best to wrap the cuff around bare skin. 6. Fit the cuff snugly around your arm. You should be able to place only one finger between the cuff and your arm. 7. Put the cord inside the groove of your elbow. 8. Press the power  button. 9. Sit quietly while the cuff fills with air and loses air. 10. Write down the numbers on the screen. 11. Wait 2-3 minutes and then repeat steps 1-10. What do the numbers mean? Two numbers make up your blood pressure. The first number is called systolic pressure. The second is called diastolic pressure. An example of a blood pressure reading is "120 over 80" (or 120/80). If you are an adult and do not have a medical condition, use this guide to find out if your blood pressure is normal: Normal  First number: below 120.  Second number: below 80. Elevated  First number: 120-129.  Second number: below 80. Hypertension stage 1  First number: 130-139.  Second number: 80-89. Hypertension stage 2  First number: 140 or above.  Second number: 90 or above. Your blood pressure is above normal even if only the top or bottom number is above normal. Follow these instructions at home:  Check your blood pressure as often as your doctor tells you to.  Take your monitor to your next doctor's appointment. Your doctor will: ? Make sure you are using it correctly. ? Make sure it is working right.  Make sure you understand what your blood pressure numbers should be.  Tell your doctor if your medicines are causing side effects. Contact a doctor if:  Your blood pressure keeps being high. Get help right away if:  Your first blood pressure number is higher than 180.  Your second blood pressure number is higher than 120. This information is not intended to replace advice given to you by your health care provider. Make sure you discuss any questions you have with your health care provider. Document Released: 12/11/2007 Document Revised: 11/26/2015 Document Reviewed: 06/06/2015 Elsevier Interactive Patient Education  2019 [employer].

## 2018-01-24 NOTE — Progress Notes (Signed)
Subjective:    Patient ID: Alyssa Oneill, female    DOB: July 31, 1980, 38 y.o.   MRN: [id number]  No chief complaint on file.   HPI  Pt is a 38 yo G2P1011 s/p SVD at [redacted]w[redacted]d delivery complicated by PPH requiring cytotec and manual uterine sweep to remove retained products.  Pt was seen today for blood pressure concern.  Pt notes elevated bp postpartum, started on nifedipine XL 30 mg by OB/Gyn.  No longer taking nifedipine.  Pt notes sys bp in the 130s-140s at home.  Pt endorses HAs with elevated BP.  Denies SOB, CP, blurred vision.  Pt endorses feeling tired, but attributes it to being up with the baby.  Pt is no longer breastfeeding, states her mild supply dried up.  Pt had a baby girl.  Past Medical History:  Diagnosis Date  . Allergy   . Eczema   . Heart murmur   . Lactose intolerance   . Medical history non-contributory   . Toxic shock syndrome (HCC)     Allergies  Allergen Reactions  . Advil [Ibuprofen] Anaphylaxis  . Other Hives, Itching and Rash    Walnuts and peacans--facial edema, hives, diarrhea  . Vicks Babyrub [Liniments] Hives, Itching and Rash    ROS General: Denies fever, chills, night sweats, changes in weight, changes in appetite HEENT: Denies ear pain, changes in vision, rhinorrhea, sore throat  +HAs with elevated bp. CV: Denies CP, palpitations, SOB, orthopnea Pulm: Denies SOB, cough, wheezing GI: Denies abdominal pain, nausea, vomiting, diarrhea, constipation GU: Denies dysuria, hematuria, frequency, vaginal discharge Msk: Denies muscle cramps, joint pains Neuro: Denies weakness, numbness, tingling Skin: Denies rashes, bruising Psych: Denies depression, anxiety, hallucinations    Objective:    Blood pressure 116/82, pulse 74, temperature 98.5 F (36.9 C), temperature source Oral, weight 107 lb (48.5 kg), SpO2 98 %, unknown if currently breastfeeding.  Gen. Pleasant, well-nourished, in no distress, normal affect  HEENT: [place]/AT, face symmetric, wearing  glasses, no scleral icterus, PERRLA, nares patent without drainage Lungs: no accessory muscle use, CTAB, no wheezes or rales Cardiovascular: RRR, no m/r/g, no peripheral edema Neuro:  A&Ox3, CN II-XII intact, normal gait  Wt Readings from Last 3 Encounters:  01/24/18 107 lb (48.5 kg)  01/13/18 107 lb (48.5 kg)  12/14/17 108 lb (49 kg)    Lab Results  Component Value Date   WBC 19.7 (H) 11/06/2017   HGB 8.8 (L) 11/06/2017   HCT 25.5 (L) 11/06/2017   PLT 252 11/06/2017   GLUCOSE 79 11/02/2017   ALT 14 11/02/2017   AST 19 11/02/2017   NA 138 11/02/2017   K 3.9 11/02/2017   CL 104 11/02/2017   CREATININE 0.56 (L) 11/02/2017   BUN 7 11/02/2017   CO2 19 (L) 11/02/2017   TSH 2.400 06/03/2017    Assessment/Plan:  Blood pressure check  -stable  116/82 -discussed continuing to monitor bp at home and keep a log to bring with her to clinic.  Pt to call clinic if bp >140/90 over the next wk. -pt encouraged to increase po intake of water and make other lifestyle modifications -Discussed medications options.  Pt wants to try breastfeeding again.  Given this will restart Procardia XL if needed. -given handouts  F/u in 2-4 wks, sooner if needed  Abbe Amsterdam, MD

## 2018-01-26 ENCOUNTER — Encounter: Payer: Self-pay | Admitting: Family Medicine

## 2018-10-23 IMAGING — US US OB COMP LESS 14 WK
1 series · 16 of 28 positions shown · non-contrast
Comparison: None.

CLINICAL DATA: Pregnant patient with vaginal bleeding.

EXAM:
OBSTETRIC <14 WK ULTRASOUND
TECHNIQUE: Transabdominal ultrasound was performed for evaluation of the
gestation as well as the maternal uterus and adnexal regions.

[Series 1: us ob comp less 14 wk · 37 acquisitions, 16 frames shown]
[im 1/37]
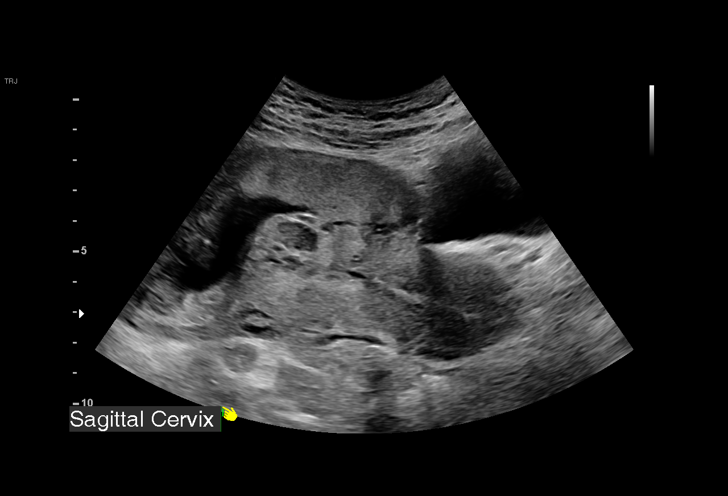
[im 3/37]
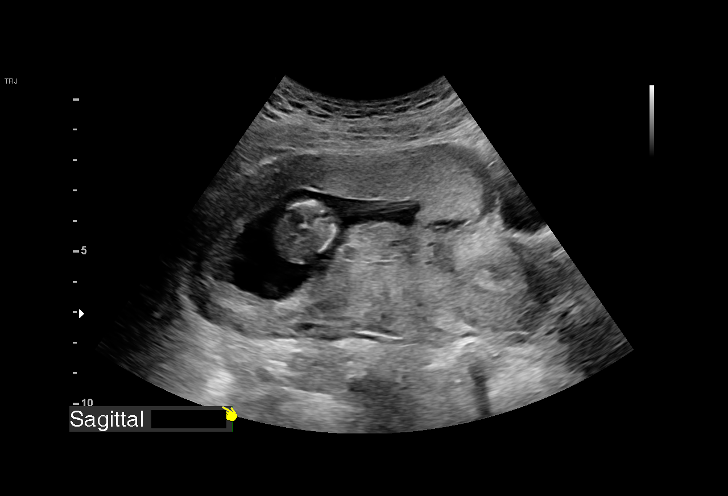
[im 6/37]
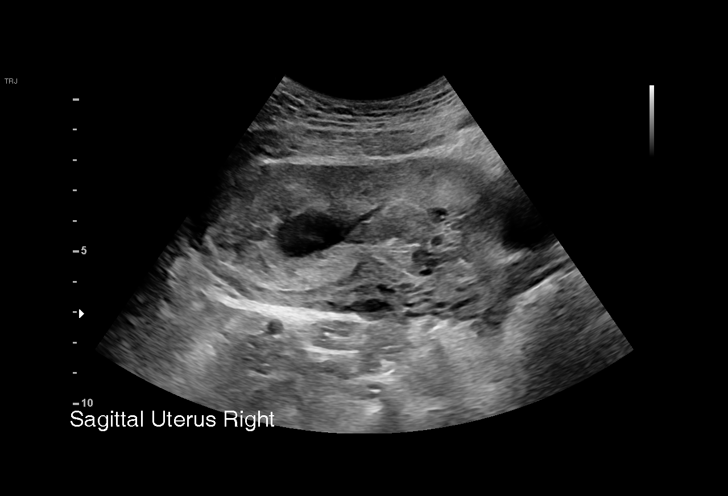
[im 9/37]
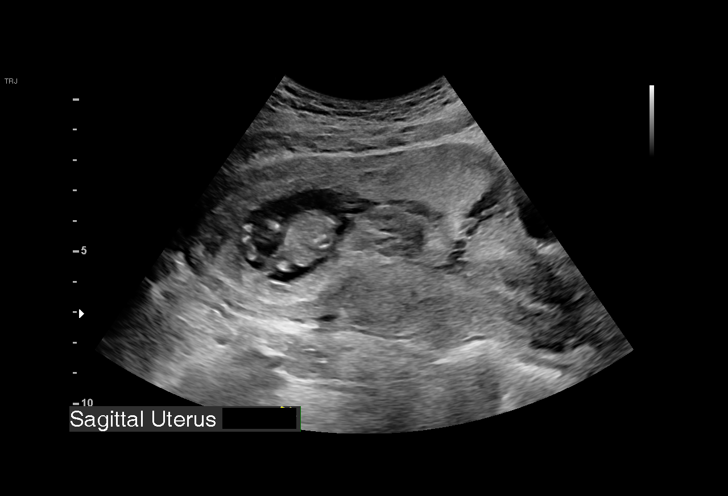
[im 10/37]
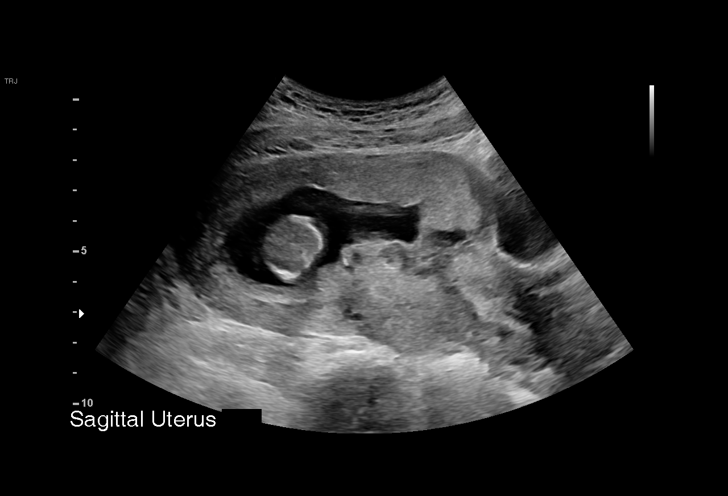
[im 13/37]
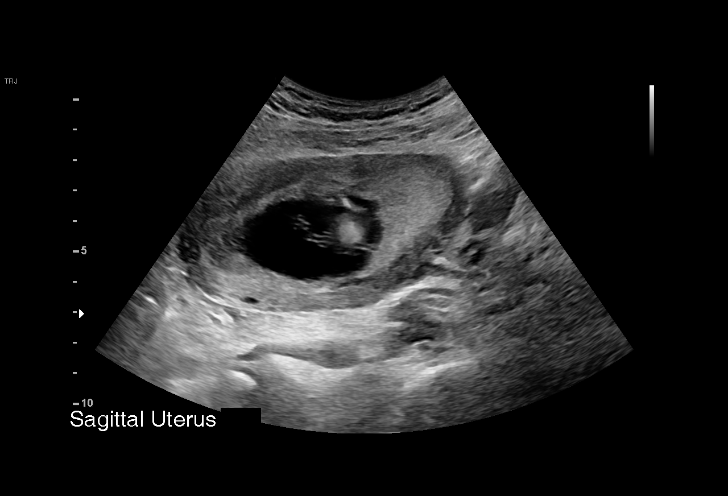
[im 15/37]
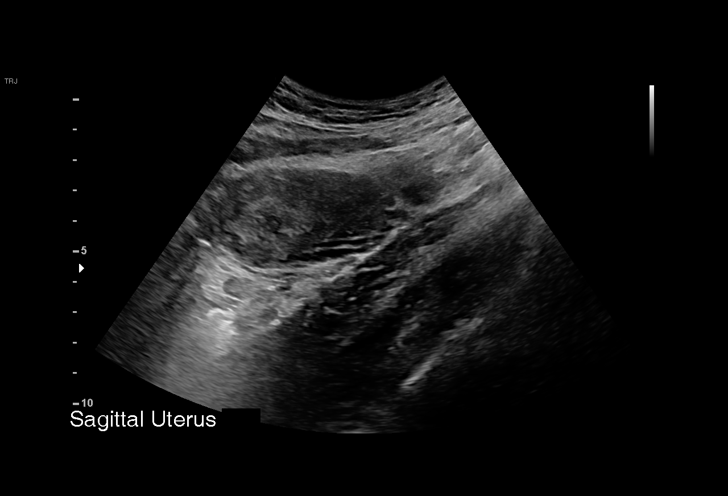
[im 18/37]
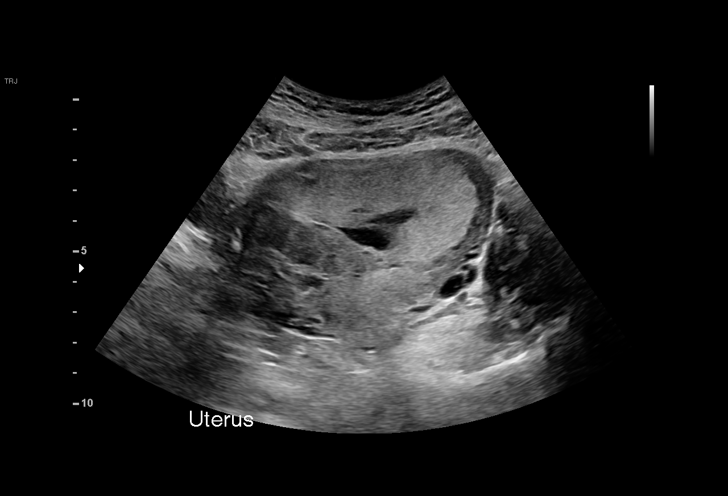
[im 19/37]
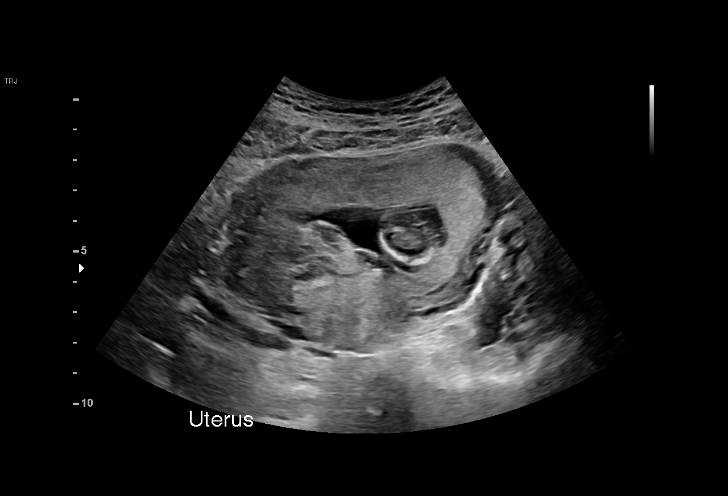
[im 22/37]
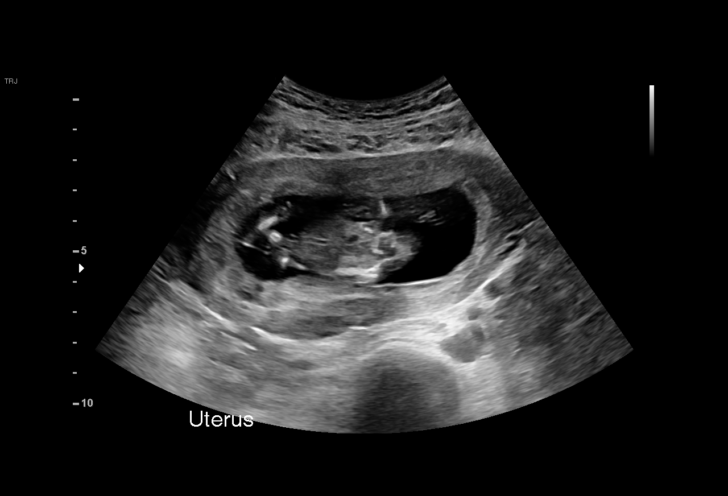
[im 25/37]
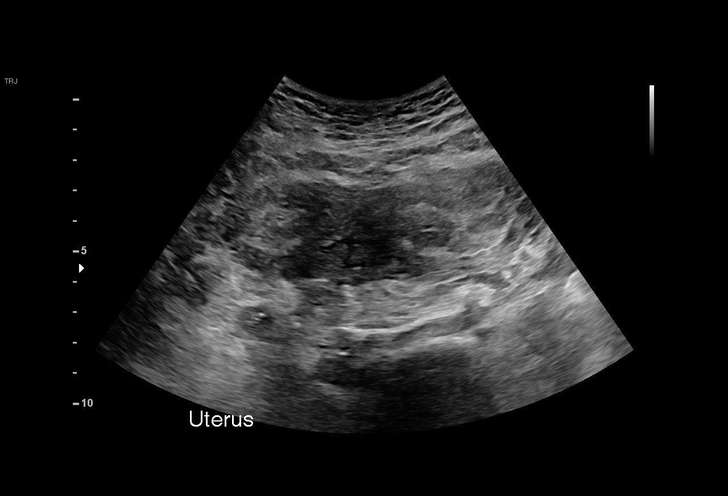
[im 27/37]
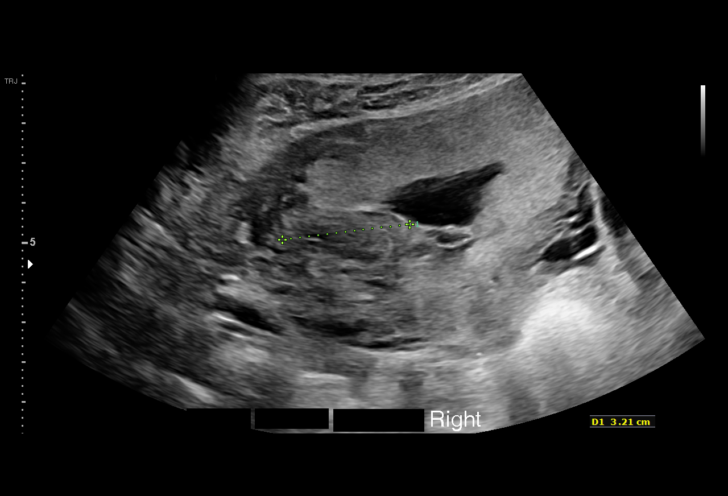
[im 29/37]
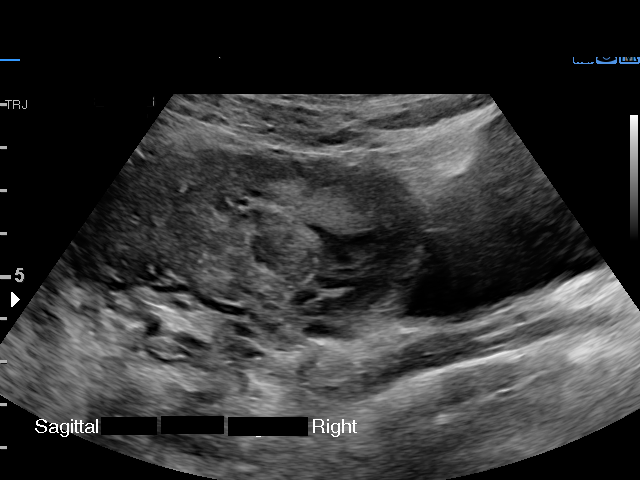
[im 31/37]
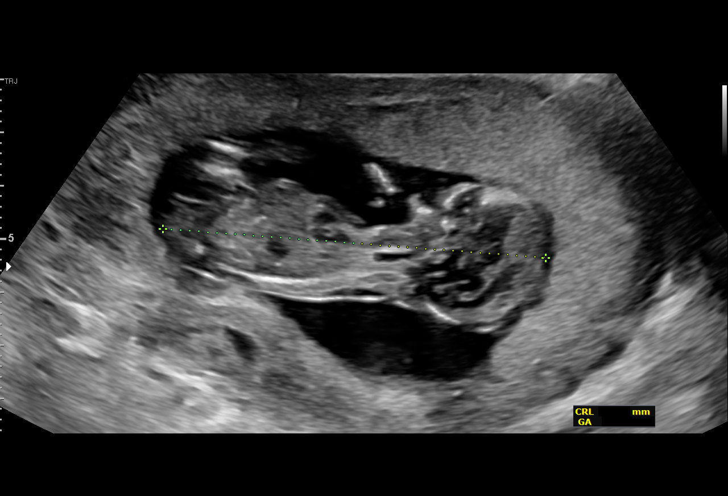
[im 34/37]
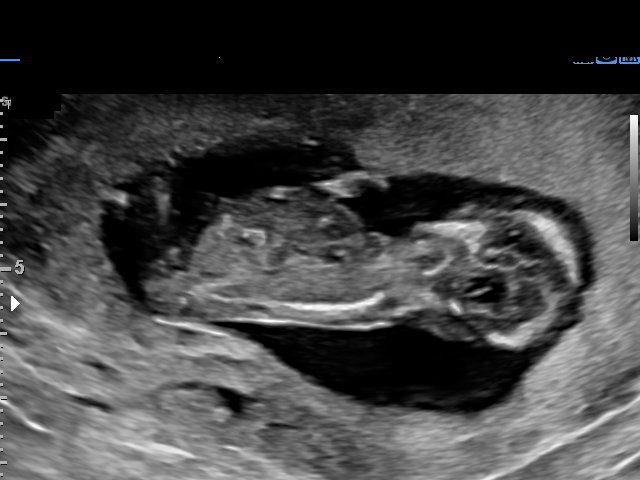
[im 37/37]
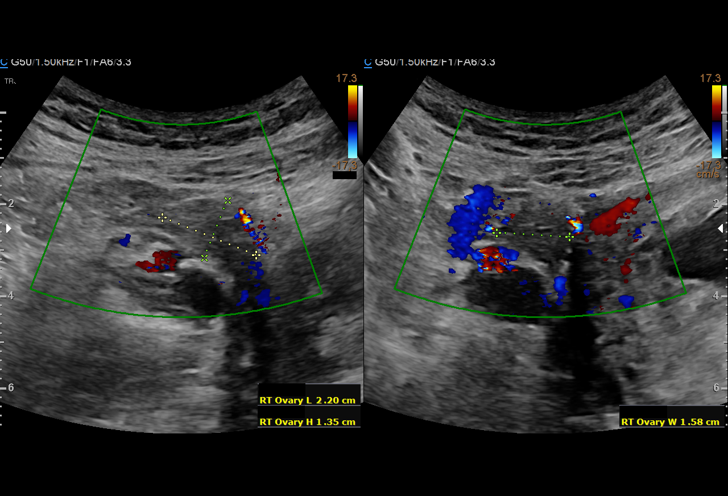

[16 of 28 positions shown; findings below may reference images not displayed]

FINDINGS: Intrauterine gestational sac: Single

Yolk sac:  Not Visualized.

Embryo:  Visualized.

Cardiac Activity: Visualized.

Heart Rate: 171 bpm

CRL:   71.5 mm   13 w 2 d                  US EDC: 11/05/2017

Subchorionic hemorrhage: Moderate subchorionic hemorrhage measuring
4.4 x 2.2 x 3.2 cm

Maternal uterus/adnexae: Normal right and left ovaries. No free
fluid in the pelvis.
IMPRESSION: Single live intrauterine gestation. Moderate subchorionic
hemorrhage.

## 2018-12-08 IMAGING — US US MFM OB DETAIL+14 WK
1 series · 14 of 28 positions shown · non-contrast
Comparison: none

[Series 1: us mfm ob detail+14 wk · 106 acquisitions, 14 frames shown]
[im 4/106]
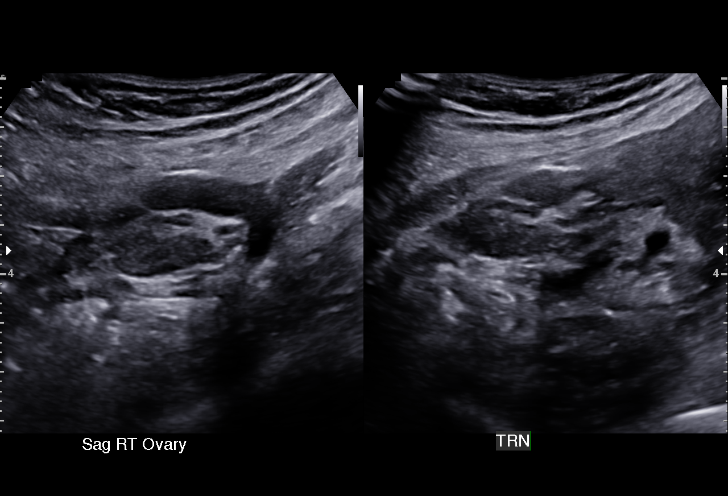
[im 12/106]
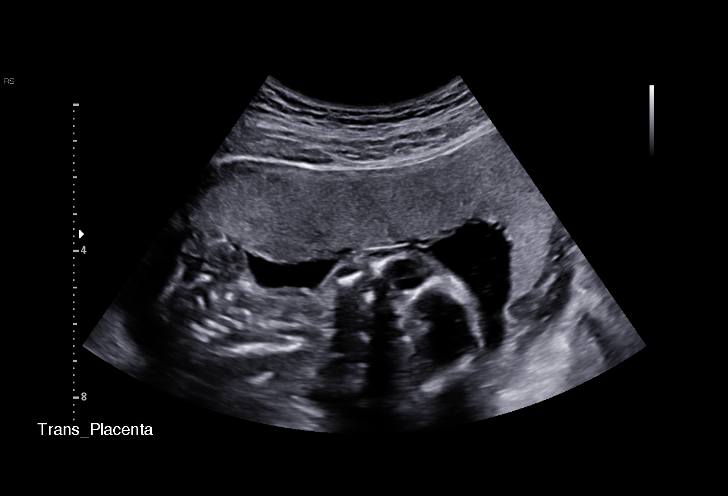
[im 20/106]
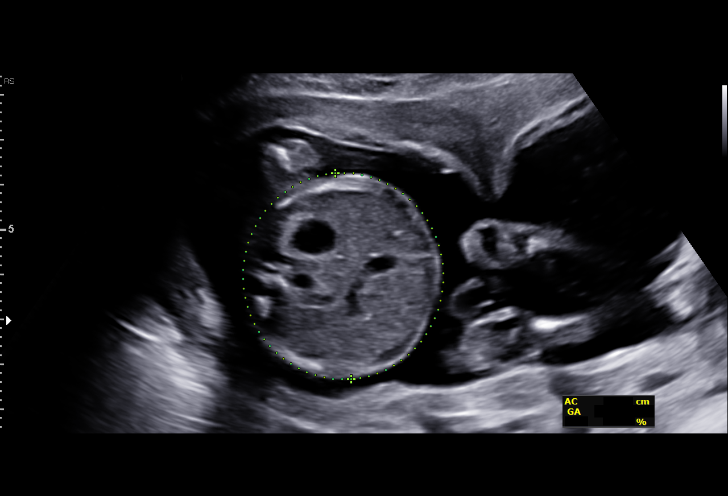
[im 28/106]
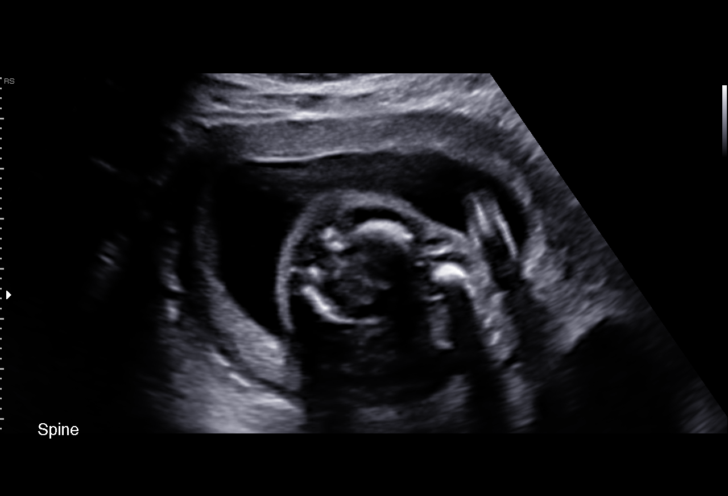
[im 36/106]
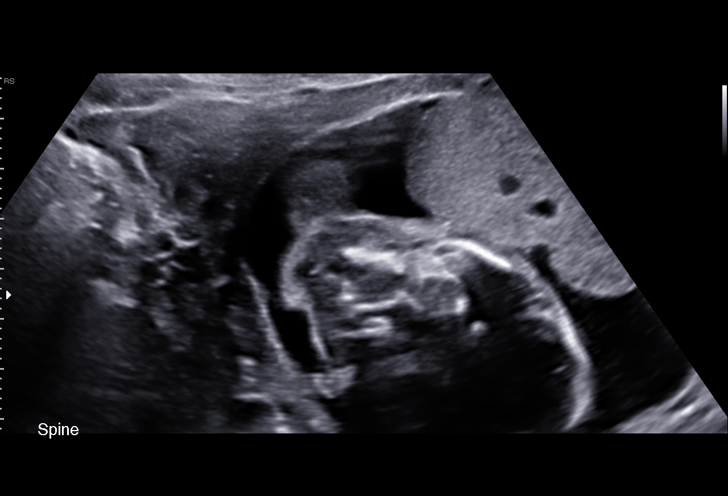
[im 43/106]
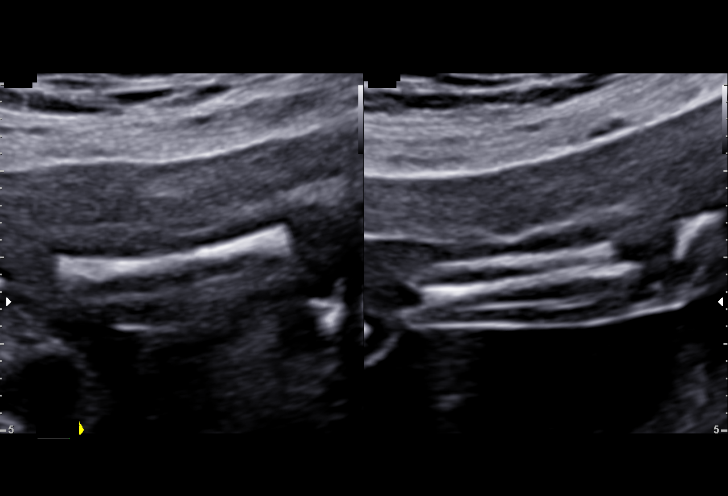
[im 51/106]
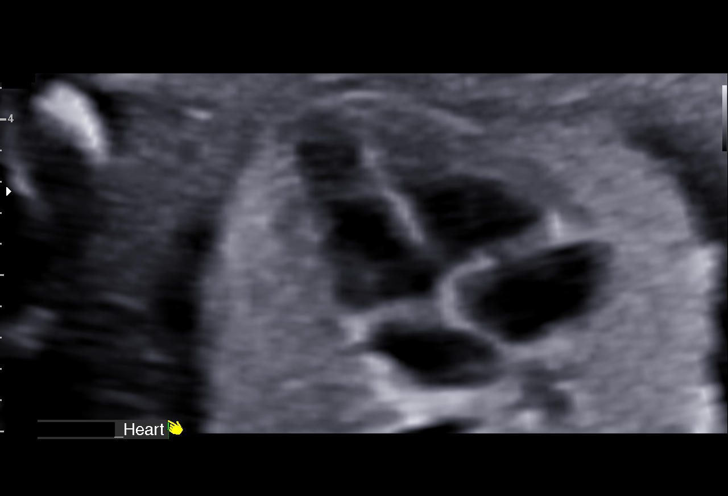
[im 59/106]
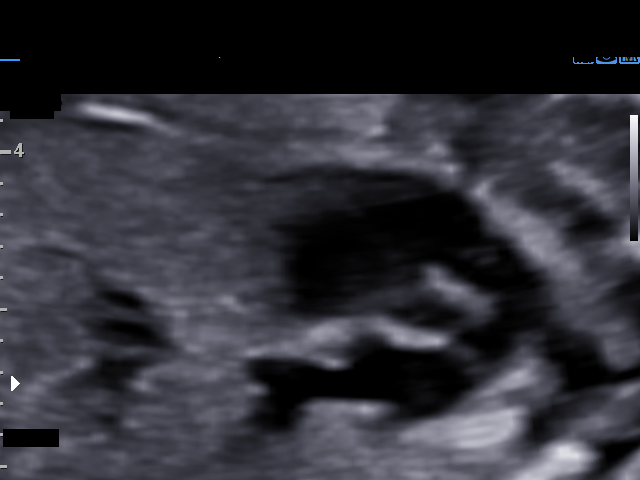
[im 67/106]
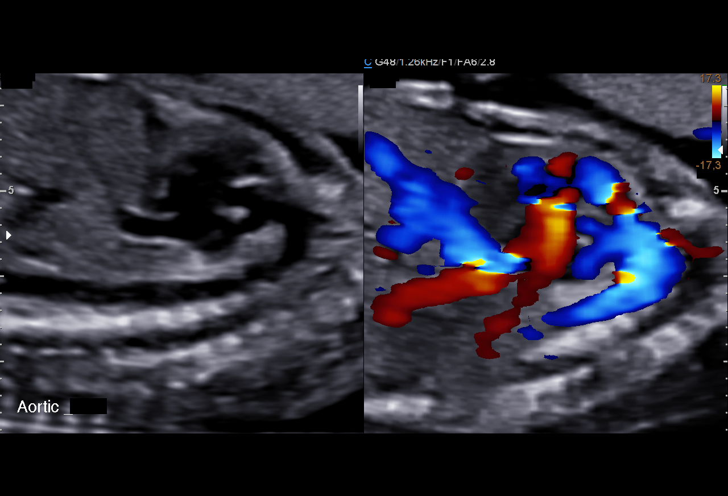
[im 74/106]
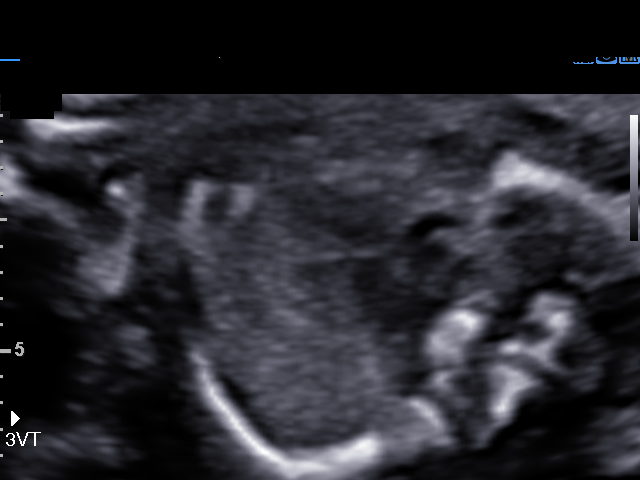
[im 82/106]
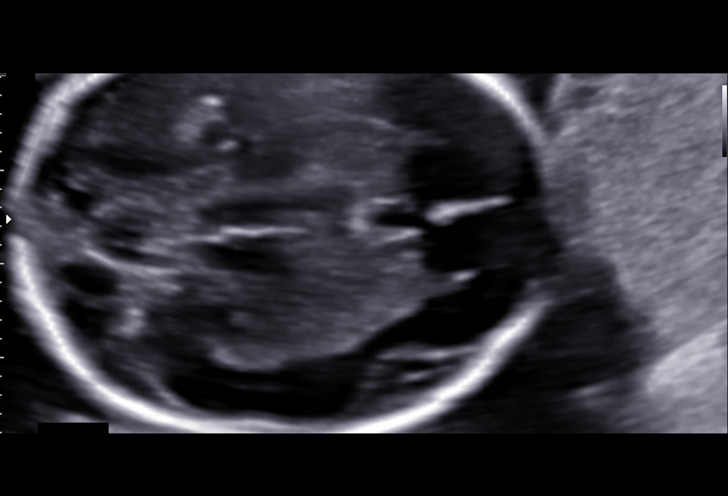
[im 90/106]
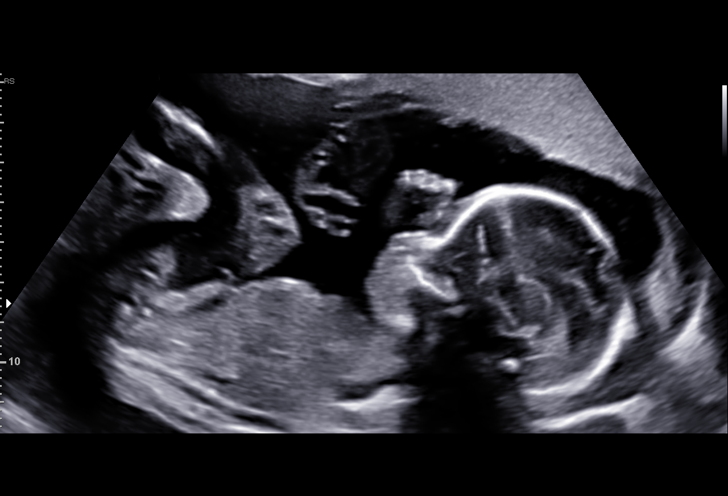
[im 98/106]
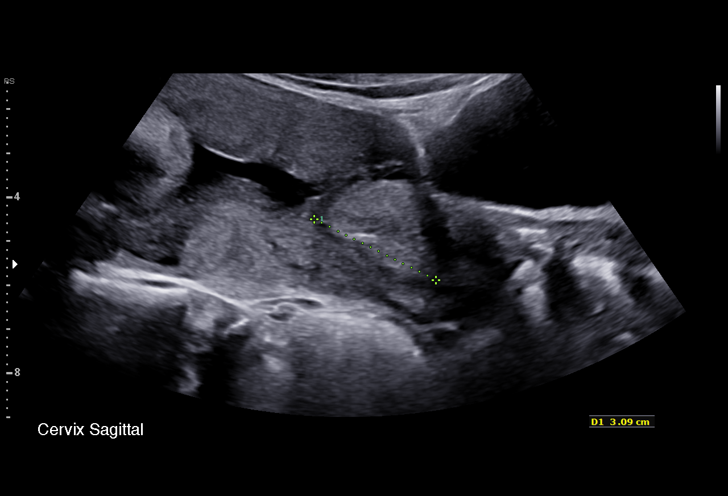
[im 106/106]
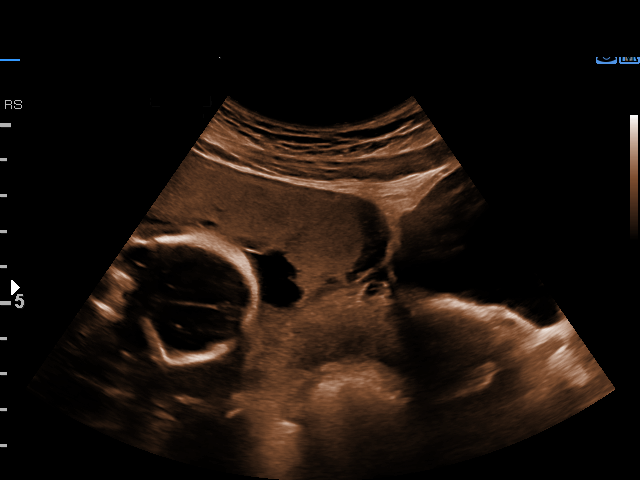

[14 of 28 positions shown; findings below may reference images not displayed]

1  TOLGA NAMBO            [id number]      [id number]     [id number]
Indications

19 weeks gestation of pregnancy
Encounter for antenatal screening for
malformations
Advanced maternal age multigravida 34+,
second trimester
OB History

Gravidity:    2          SAB:   1
Living:       0
Fetal Evaluation

Num Of Fetuses:     1
Fetal Heart         136
Rate(bpm):
Cardiac Activity:   Observed
Presentation:       Cephalic
Placenta:           Anterior, above cervical os
P. Cord Insertion:  Visualized

Amniotic Fluid
AFI FV:      Subjectively within normal limits

Largest Pocket(cm)
3.2
Biometry

BPD:      43.7  mm     G. Age:  19w 1d         60  %    CI:         70.3   %    70 - 86
FL/HC:      17.2   %    16.1 -
HC:      166.2  mm     G. Age:  19w 2d         58  %    HC/AC:      1.17        1.09 -
AC:      142.6  mm     G. Age:  19w 4d         66  %    FL/BPD:     65.4   %
FL:       28.6  mm     G. Age:  18w 5d         36  %    FL/AC:      20.1   %    20 - 24
HUM:      26.9  mm     G. Age:  18w 4d         38  %
CER:      19.3  mm     G. Age:  18w 5d         40  %
NFT:       4.3  mm
CM:        5.6  mm

Est. FW:     281  gm    0 lb 10 oz      49  %
Gestational Age

LMP:           19w 0d        Date:  02/04/17                 EDD:   11/11/17
U/S Today:     19w 1d                                        EDD:   11/10/17
Best:          19w 0d     Det. By:  LMP  (02/04/17)          EDD:   11/11/17
Anatomy

Cranium:               Appears normal         Aortic Arch:            Appears normal
Cavum:                 Appears normal         Ductal Arch:            Appears normal
Ventricles:            Appears normal         Diaphragm:              Appears normal
Choroid Plexus:        Appears normal         Stomach:                Appears normal, left
sided
Cerebellum:            Appears normal         Abdomen:                Appears normal
Posterior Fossa:       Appears normal         Abdominal Wall:         Appears nml (cord
insert, abd wall)
Nuchal Fold:           Appears normal         Cord Vessels:           Appears normal (3
vessel cord)
Face:                  Appears normal         Kidneys:                Appear normal
(orbits and profile)
Lips:                  Appears normal         Bladder:                Appears normal
Thoracic:              Appears normal         Spine:                  Appears normal
Heart:                 Appears normal         Upper Extremities:      Appears normal
(4CH, axis, and situs
RVOT:                  Appears normal         Lower Extremities:      Appears normal
LVOT:                  Appears normal

Other:  Fetus appears to be a female. Heels and 5th digit visualized.
Technically difficult due to fetal position.
Cervix Uterus Adnexa

Cervix
Length:              3  cm.
Normal appearance by transabdominal scan.

Uterus
No abnormality visualized.

Left Ovary
Within normal limits.

Right Ovary
Within normal limits.

Adnexa:       No abnormality visualized. No adnexal mass
visualized.
Impression

Single living intrauterine pregnancy at 19w 0d.
Cephalic presentation.
Placenta Anterior, above cervical os.
Appropriate fetal growth.
Normal amniotic fluid volume.
The fetal anatomic survey is complete.
Normal fetal anatomy.
No fetal anomalies or soft markers of aneuploidy seen.
The adnexa appear normal bilaterally without masses.
The cervix measures 3cm on transabdominal imaging without
funneling.
Recommendations

Follow-up ultrasounds as clinically indicated.

## 2019-04-05 ENCOUNTER — Ambulatory Visit: Payer: Medicaid Other | Admitting: Family Medicine
